# Patient Record
Sex: Female | Born: 1975 | Race: Black or African American | Hispanic: No | Marital: Single | Smoking: Never smoker
Health system: Southern US, Community
[De-identification: ages and names within clinical notes are randomized; demographics above are authoritative.]

## PROBLEM LIST (undated history)

## (undated) DIAGNOSIS — J45909 Unspecified asthma, uncomplicated: Secondary | ICD-10-CM

---

## 2000-12-19 ENCOUNTER — Emergency Department (HOSPITAL_COMMUNITY): Admission: EM | Admit: 2000-12-19 | Discharge: 2000-12-19 | Payer: Self-pay | Admitting: Emergency Medicine

## 2000-12-19 ENCOUNTER — Encounter: Payer: Self-pay | Admitting: Emergency Medicine

## 2001-06-10 ENCOUNTER — Emergency Department (HOSPITAL_COMMUNITY): Admission: EM | Admit: 2001-06-10 | Discharge: 2001-06-10 | Payer: Self-pay | Admitting: Emergency Medicine

## 2001-09-12 ENCOUNTER — Encounter: Admission: RE | Admit: 2001-09-12 | Discharge: 2001-10-22 | Payer: Self-pay | Admitting: Neurology

## 2002-02-25 ENCOUNTER — Emergency Department (HOSPITAL_COMMUNITY): Admission: EM | Admit: 2002-02-25 | Discharge: 2002-02-25 | Payer: Self-pay | Admitting: Emergency Medicine

## 2002-07-15 ENCOUNTER — Emergency Department (HOSPITAL_COMMUNITY): Admission: EM | Admit: 2002-07-15 | Discharge: 2002-07-15 | Payer: Self-pay

## 2002-07-17 ENCOUNTER — Emergency Department (HOSPITAL_COMMUNITY): Admission: EM | Admit: 2002-07-17 | Discharge: 2002-07-17 | Payer: Self-pay | Admitting: Emergency Medicine

## 2002-08-02 ENCOUNTER — Emergency Department (HOSPITAL_COMMUNITY): Admission: EM | Admit: 2002-08-02 | Discharge: 2002-08-02 | Payer: Self-pay | Admitting: Emergency Medicine

## 2002-10-06 ENCOUNTER — Emergency Department (HOSPITAL_COMMUNITY): Admission: EM | Admit: 2002-10-06 | Discharge: 2002-10-06 | Payer: Self-pay

## 2004-09-16 ENCOUNTER — Emergency Department (HOSPITAL_COMMUNITY): Admission: EM | Admit: 2004-09-16 | Discharge: 2004-09-16 | Payer: Self-pay | Admitting: Emergency Medicine

## 2004-09-23 ENCOUNTER — Emergency Department (HOSPITAL_COMMUNITY): Admission: EM | Admit: 2004-09-23 | Discharge: 2004-09-23 | Payer: Self-pay | Admitting: Emergency Medicine

## 2004-09-26 ENCOUNTER — Encounter: Admission: RE | Admit: 2004-09-26 | Discharge: 2004-09-26 | Payer: Self-pay | Admitting: Neurology

## 2005-12-07 ENCOUNTER — Emergency Department (HOSPITAL_COMMUNITY): Admission: EM | Admit: 2005-12-07 | Discharge: 2005-12-07 | Payer: Self-pay | Admitting: Emergency Medicine

## 2006-04-25 ENCOUNTER — Emergency Department (HOSPITAL_COMMUNITY): Admission: EM | Admit: 2006-04-25 | Discharge: 2006-04-25 | Payer: Self-pay | Admitting: *Deleted

## 2006-10-08 ENCOUNTER — Emergency Department (HOSPITAL_COMMUNITY): Admission: EM | Admit: 2006-10-08 | Discharge: 2006-10-08 | Payer: Self-pay | Admitting: *Deleted

## 2006-12-01 ENCOUNTER — Encounter: Admission: RE | Admit: 2006-12-01 | Discharge: 2006-12-01 | Payer: Self-pay | Admitting: *Deleted

## 2007-03-19 ENCOUNTER — Emergency Department (HOSPITAL_COMMUNITY): Admission: EM | Admit: 2007-03-19 | Discharge: 2007-03-19 | Payer: Self-pay | Admitting: Emergency Medicine

## 2007-09-02 ENCOUNTER — Emergency Department (HOSPITAL_COMMUNITY): Admission: EM | Admit: 2007-09-02 | Discharge: 2007-09-02 | Payer: Self-pay | Admitting: Emergency Medicine

## 2008-01-12 ENCOUNTER — Emergency Department (HOSPITAL_COMMUNITY): Admission: EM | Admit: 2008-01-12 | Discharge: 2008-01-13 | Payer: Self-pay | Admitting: Emergency Medicine

## 2009-05-26 ENCOUNTER — Encounter: Admission: RE | Admit: 2009-05-26 | Discharge: 2009-05-26 | Payer: Self-pay | Admitting: Family Medicine

## 2010-06-14 ENCOUNTER — Emergency Department (HOSPITAL_COMMUNITY): Admission: EM | Admit: 2010-06-14 | Discharge: 2010-06-15 | Payer: Self-pay | Admitting: Emergency Medicine

## 2010-07-03 ENCOUNTER — Emergency Department (HOSPITAL_COMMUNITY)
Admission: EM | Admit: 2010-07-03 | Discharge: 2010-07-03 | Payer: Self-pay | Source: Home / Self Care | Admitting: Emergency Medicine

## 2010-09-27 LAB — POCT PREGNANCY, URINE: Preg Test, Ur: NEGATIVE

## 2010-09-27 LAB — URINALYSIS, ROUTINE W REFLEX MICROSCOPIC
Bilirubin Urine: NEGATIVE
Glucose, UA: NEGATIVE mg/dL
Hgb urine dipstick: NEGATIVE
Protein, ur: NEGATIVE mg/dL
Urobilinogen, UA: 0.2 mg/dL (ref 0.0–1.0)

## 2010-09-29 LAB — URINALYSIS, ROUTINE W REFLEX MICROSCOPIC
Ketones, ur: NEGATIVE mg/dL
Protein, ur: NEGATIVE mg/dL
Specific Gravity, Urine: 1.032 — ABNORMAL HIGH (ref 1.005–1.030)
pH: 5.5 (ref 5.0–8.0)

## 2011-04-29 LAB — CBC
MCHC: 34.1
MCV: 86.2
Platelets: 211
WBC: 4.8

## 2011-04-29 LAB — I-STAT 8, (EC8 V) (CONVERTED LAB)
Glucose, Bld: 82
HCT: 44
Hemoglobin: 15
Potassium: 4
Sodium: 140
TCO2: 27
pCO2, Ven: 51.1 — ABNORMAL HIGH
pH, Ven: 7.303 — ABNORMAL HIGH

## 2011-04-29 LAB — DIFFERENTIAL
Eosinophils Absolute: 0.2
Eosinophils Relative: 5
Lymphocytes Relative: 34
Lymphs Abs: 1.6
Monocytes Absolute: 0.5
Neutrophils Relative %: 52

## 2011-04-29 LAB — POCT PREGNANCY, URINE: Preg Test, Ur: NEGATIVE

## 2011-04-29 LAB — POCT I-STAT CREATININE
Creatinine, Ser: 0.9
Operator id: 270111

## 2011-05-03 ENCOUNTER — Emergency Department (HOSPITAL_COMMUNITY): Payer: No Typology Code available for payment source

## 2011-05-03 ENCOUNTER — Emergency Department (HOSPITAL_COMMUNITY)
Admission: EM | Admit: 2011-05-03 | Discharge: 2011-05-03 | Disposition: A | Discharge status: Home / Self Care | Payer: No Typology Code available for payment source | Attending: Emergency Medicine | Admitting: Emergency Medicine

## 2011-05-03 DIAGNOSIS — M546 Pain in thoracic spine: Secondary | ICD-10-CM | POA: Insufficient documentation

## 2011-05-03 DIAGNOSIS — M25569 Pain in unspecified knee: Secondary | ICD-10-CM | POA: Insufficient documentation

## 2012-08-11 ENCOUNTER — Encounter (HOSPITAL_COMMUNITY): Payer: Self-pay | Admitting: Emergency Medicine

## 2012-08-11 ENCOUNTER — Emergency Department (HOSPITAL_COMMUNITY)
Admission: EM | Admit: 2012-08-11 | Discharge: 2012-08-11 | Disposition: A | Discharge status: Home / Self Care | Payer: Self-pay | Source: Home / Self Care | Attending: Family Medicine | Admitting: Family Medicine

## 2012-08-11 DIAGNOSIS — J45909 Unspecified asthma, uncomplicated: Secondary | ICD-10-CM

## 2012-08-11 HISTORY — DX: Unspecified asthma, uncomplicated: J45.909

## 2012-08-11 MED ORDER — PREDNISONE 10 MG PO TABS: ORAL_TABLET | ORAL | Status: DC

## 2012-08-11 MED ORDER — ALBUTEROL SULFATE (5 MG/ML) 0.5% IN NEBU
2.5000 mg | INHALATION_SOLUTION | Freq: Once | RESPIRATORY_TRACT | Status: AC
  Administered 2012-08-11: 2.5 mg via RESPIRATORY_TRACT

## 2012-08-11 MED ORDER — PREDNISONE 20 MG PO TABS
60.0000 mg | ORAL_TABLET | Freq: Every day | ORAL | Status: DC
  Administered 2012-08-11: 60 mg via ORAL

## 2012-08-11 MED ORDER — PREDNISONE 20 MG PO TABS
ORAL_TABLET | ORAL | Status: AC
  Filled 2012-08-11: qty 3

## 2012-08-11 MED ORDER — ALBUTEROL SULFATE (5 MG/ML) 0.5% IN NEBU
INHALATION_SOLUTION | RESPIRATORY_TRACT | Status: AC
  Filled 2012-08-11: qty 0.5

## 2012-08-11 NOTE — ED Notes (Signed)
Pt is here for asthma flare up x3 days. Saw here PCP on Wed for the same reason and was given Zpac that she's still taking and tolerating well Sx today include: dry cough, SOB, tightness of chest Denies: fevers, vomiting, nauseas, diarrhea Also taking ventolin and advair   She is alert w/no signs of acute distress.

## 2012-08-11 NOTE — ED Provider Notes (Signed)
History     CSN: [id number]  Arrival date & time 08/11/12  1442   First MD Initiated Contact with Patient 08/11/12 1550      Chief Complaint  Patient presents with  . Asthma    (Consider location/radiation/quality/duration/timing/severity/associated sxs/prior treatment) Patient is a 37 y.o. female presenting with asthma. The history is provided by the patient. No language interpreter was used.  Asthma This is a new problem. The current episode started more than 2 days ago. The problem occurs constantly. The problem has been gradually worsening. Nothing aggravates the symptoms. Nothing relieves the symptoms. She has tried nothing for the symptoms.  Pt complains of asthma for 4 days.  Pt saw Dr. Concepcion Elk and is on zithromax but she is still having asthma problems.  Past Medical History  Diagnosis Date  . Asthma     History reviewed. No pertinent past surgical history.  No family history on file.  History  Substance Use Topics  . Smoking status: Never Smoker   . Smokeless tobacco: Not on file  . Alcohol Use: No    OB History    Grav Para Term Preterm Abortions TAB SAB Ect Mult Living                  Review of Systems  Respiratory: Positive for cough and wheezing.   All other systems reviewed and are negative.    Allergies  Review of patient's allergies indicates no known allergies.  Home Medications  No current outpatient prescriptions on file.  BP 102/59  Pulse 70  Temp 98.8 F (37.1 C) (Oral)  Resp 16  SpO2 100%  LMP 08/11/2012  Physical Exam  Nursing note and vitals reviewed. Constitutional: She appears well-developed and well-nourished.  HENT:  Head: Normocephalic and atraumatic.  Eyes: Pupils are equal, round, and reactive to light.  Neck: Normal range of motion. Neck supple.  Cardiovascular: Normal rate.   Pulmonary/Chest: Effort normal. She has wheezes.  Abdominal: Soft.  Musculoskeletal: Normal range of motion.  Neurological: She is alert.   Skin: Skin is warm.    ED Course  Procedures (including critical care time)  Labs Reviewed - No data to display No results found.   No diagnosis found.    MDM  Prednisone po albuterol neb.   Pt given rx for prednisone tape        Lonia Skinner [place], [place] 08/11/12 1733

## 2012-08-13 NOTE — ED Provider Notes (Signed)
Medical screening examination/treatment/procedure(s) were performed by resident physician or non-physician practitioner and as supervising physician I was immediately available for consultation/collaboration.   Heitor Steinhoff DOUGLAS MD.    Karem Farha D Hilbert Briggs, MD 08/13/12 1600 

## 2012-10-03 ENCOUNTER — Other Ambulatory Visit: Payer: Self-pay | Admitting: Internal Medicine

## 2012-10-03 DIAGNOSIS — N644 Mastodynia: Secondary | ICD-10-CM

## 2012-10-10 ENCOUNTER — Other Ambulatory Visit: Payer: Self-pay | Admitting: Internal Medicine

## 2012-10-10 ENCOUNTER — Ambulatory Visit
Admission: RE | Admit: 2012-10-10 | Discharge: 2012-10-10 | Disposition: A | Discharge status: Home / Self Care | Payer: No Typology Code available for payment source | Source: Ambulatory Visit | Attending: Internal Medicine | Admitting: Internal Medicine

## 2012-10-10 DIAGNOSIS — N644 Mastodynia: Secondary | ICD-10-CM
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
## 2013-05-23 ENCOUNTER — Other Ambulatory Visit: Payer: Self-pay | Admitting: Internal Medicine

## 2013-05-23 DIAGNOSIS — N644 Mastodynia: Secondary | ICD-10-CM

## 2013-06-11 ENCOUNTER — Ambulatory Visit
Admission: RE | Admit: 2013-06-11 | Discharge: 2013-06-11 | Disposition: A | Discharge status: Home / Self Care | Payer: No Typology Code available for payment source | Source: Ambulatory Visit | Attending: Internal Medicine | Admitting: Internal Medicine

## 2013-06-11 ENCOUNTER — Other Ambulatory Visit: Payer: Self-pay | Admitting: Internal Medicine

## 2013-06-11 DIAGNOSIS — N644 Mastodynia: Secondary | ICD-10-CM

## 2013-06-18 ENCOUNTER — Other Ambulatory Visit: Payer: Self-pay

## 2013-08-22 ENCOUNTER — Other Ambulatory Visit: Payer: Self-pay

## 2013-08-29 ENCOUNTER — Inpatient Hospital Stay: Admission: RE | Admit: 2013-08-29 | Payer: Self-pay | Source: Ambulatory Visit

## 2013-09-10 ENCOUNTER — Ambulatory Visit
Admission: RE | Admit: 2013-09-10 | Discharge: 2013-09-10 | Disposition: A | Discharge status: Home / Self Care | Payer: No Typology Code available for payment source | Source: Ambulatory Visit | Attending: Internal Medicine | Admitting: Internal Medicine

## 2013-09-10 ENCOUNTER — Other Ambulatory Visit: Payer: Self-pay

## 2013-09-10 DIAGNOSIS — N644 Mastodynia: Secondary | ICD-10-CM

## 2015-04-05 ENCOUNTER — Emergency Department (HOSPITAL_COMMUNITY)
Admission: EM | Admit: 2015-04-05 | Discharge: 2015-04-05 | Disposition: A | Discharge status: Home / Self Care | Payer: Self-pay | Attending: Emergency Medicine | Admitting: Emergency Medicine

## 2015-04-05 ENCOUNTER — Encounter (HOSPITAL_COMMUNITY): Payer: Self-pay | Admitting: *Deleted

## 2015-04-05 DIAGNOSIS — L298 Other pruritus: Secondary | ICD-10-CM | POA: Insufficient documentation

## 2015-04-05 DIAGNOSIS — J45909 Unspecified asthma, uncomplicated: Secondary | ICD-10-CM | POA: Insufficient documentation

## 2015-04-05 DIAGNOSIS — L299 Pruritus, unspecified: Secondary | ICD-10-CM

## 2015-04-05 MED ORDER — LORATADINE 10 MG PO TABS: 10.0000 mg | ORAL_TABLET | Freq: Every day | ORAL | Status: AC

## 2015-04-05 NOTE — ED Notes (Signed)
Pt reports generalized itching and tingling x 2 weeks intermittently.  Pt reports she was seen by her PCP x 10 days ago for same and had blood work done.  Pt denies any rash but states that when she gets the itching episode, she also has tingling and gets hot.  Pt denies ever taking benadryl for the itching.

## 2015-04-05 NOTE — ED Provider Notes (Signed)
CSN: [id number]     Arrival date & time 04/05/15  1114 History  This chart was scribed for non-physician practitioner, Eyvonne Mechanic, PA-C,  working with Nelva Nay, MD by Lauren Busman, ED Scribe. This patient was seen in room WTR5/WTR5 and the patient's care was started at 1:16 PM.    Chief Complaint  Patient presents with  . Pruritis   The history is provided by the patient. No language interpreter was used.    HPI Comments:  Lauren Houston is a 39 y.o. female who presents to the Emergency Department complaining of diffuse pruritis intermittently for 2 weeks. She notes her symptom is worse at night. She also describes a tingling sensation. She denies a history of the same. She also denies changes in soaps, lotions, fragrances, bedding, recent insect bites, rash, rhinorrhea, fever, and history of allergies. No alleviating factors noted. Pt notes she was evaluated by her PCP on 03/25/15;  states he did bloodwork that was negative except for low Vitamin D. Pt states her PCP instructed her to follow up this week.Denies rash.  PCP Advery    Past Medical History  Diagnosis Date  . Asthma    History reviewed. No pertinent past surgical history. No family history on file. Social History  Substance Use Topics  . Smoking status: Never Smoker   . Smokeless tobacco: None  . Alcohol Use: No   OB History    No data available     Review of Systems  A complete 10 system review of systems was obtained and all systems are negative except as noted in the HPI and PMH.    Allergies  Review of patient's allergies indicates no known allergies.  Home Medications   Prior to Admission medications   Medication Sig Start Date End Date Taking? Authorizing Provider  loratadine (CLARITIN) 10 MG tablet Take 1 tablet (10 mg total) by mouth daily. 04/05/15   Jeffrey Hedges, PA-C   BP 101/61 mmHg  Pulse 78  Temp(Src) 98.4 F (36.9 C) (Oral)  Resp 14  SpO2 100%  LMP 03/15/2015   Physical Exam   Constitutional: She is oriented to person, place, and time. She appears well-developed and well-nourished. No distress.  HENT:  Head: Normocephalic and atraumatic.  Mouth/Throat: Oropharynx is clear and moist. No oropharyngeal exudate.  Eyes: Conjunctivae are normal.  Cardiovascular: Normal rate.   Pulmonary/Chest: Effort normal.  Abdominal: She exhibits no distension.  Neurological: She is alert and oriented to person, place, and time.  Skin: Skin is warm and dry. No rash noted. No erythema. No pallor.  Psychiatric: She has a normal mood and affect.  Nursing note and vitals reviewed.   ED Course  Procedures   Labs Review Labs Reviewed - No data to display  Imaging Review No results found. I have personally reviewed and evaluated these images and lab results as part of my medical decision-making.   EKG Interpretation None      MDM   Final diagnoses:  Pruritus    Labs:   Imaging:  Consults:  Therapeutics:  Discharge Meds:   Assessment/Plan: Patient presents with pruritus she is seen by her primary care for this with laboratory evaluation. I am uncertain what labs were drawn as I cannot access this in our chart. Patient has not tried any symptomatic therapies at this time, uncertain etiology of her itching. Patient has no fever, vital signs reassuring, no significant findings on exam. She'll be instructed use Claritin, follow-up with her primary care for reevaluation.  I personally performed the services described in this documentation, which was scribed in my presence. The recorded information has been reviewed and is accurate.    Eyvonne Mechanic, PA-C 04/05/15 1338  Nelva Nay, MD 04/06/15 [phone]

## 2015-04-05 NOTE — Discharge Instructions (Signed)
Pruritus  °Pruritus is an itch. There are many different problems that can cause an itch. Dry skin is one of the most common causes of itching. Most cases of itching do not require medical attention.  °HOME CARE INSTRUCTIONS  °Make sure your skin is moistened on a regular basis. A moisturizer that contains petroleum jelly is best for keeping moisture in your skin. If you develop a rash, you may try the following for relief:  °· Use corticosteroid cream. °· Apply cool compresses to the affected areas. °· Bathe with Epsom salts or baking soda in the bathwater. °· Soak in colloidal oatmeal baths. These are available at your pharmacy. °· Apply baking soda paste to the rash. Stir water into baking soda until it reaches a paste-like consistency. °· Use an anti-itch lotion. °· Take over-the-counter diphenhydramine medicine by mouth as the instructions direct. °· Avoid scratching. Scratching may cause the rash to become infected. If itching is very bad, your caregiver may suggest prescription lotions or creams to lessen your symptoms. °· Avoid hot showers, which can make itching worse. A cold shower may help with itching as long as you use a moisturizer after the shower. °SEEK MEDICAL CARE IF: °The itching does not go away after several days. °Document Released: 03/16/2011 Document Revised: 11/18/2013 Document Reviewed: 03/16/2011 °[employer]® Patient Information ©2015 [employer]. This information is not intended to replace advice given to you by your health care provider. Make sure you discuss any questions you have with your health care provider. ° °

## 2022-03-17 ENCOUNTER — Encounter (HOSPITAL_COMMUNITY): Payer: Self-pay

## 2022-03-17 ENCOUNTER — Emergency Department (HOSPITAL_COMMUNITY): Payer: Self-pay

## 2022-03-17 ENCOUNTER — Emergency Department (HOSPITAL_COMMUNITY)
Admission: EM | Admit: 2022-03-17 | Discharge: 2022-03-17 | Disposition: A | Discharge status: Home / Self Care | Payer: Self-pay | Attending: Emergency Medicine | Admitting: Emergency Medicine

## 2022-03-17 ENCOUNTER — Ambulatory Visit (HOSPITAL_COMMUNITY): Admission: RE | Admit: 2022-03-17 | Payer: Self-pay | Source: Ambulatory Visit

## 2022-03-17 ENCOUNTER — Emergency Department (HOSPITAL_BASED_OUTPATIENT_CLINIC_OR_DEPARTMENT_OTHER): Payer: Self-pay

## 2022-03-17 DIAGNOSIS — M7989 Other specified soft tissue disorders: Secondary | ICD-10-CM | POA: Insufficient documentation

## 2022-03-17 DIAGNOSIS — R109 Unspecified abdominal pain: Secondary | ICD-10-CM

## 2022-03-17 DIAGNOSIS — R202 Paresthesia of skin: Secondary | ICD-10-CM | POA: Insufficient documentation

## 2022-03-17 DIAGNOSIS — R200 Anesthesia of skin: Secondary | ICD-10-CM

## 2022-03-17 DIAGNOSIS — N9489 Other specified conditions associated with female genital organs and menstrual cycle: Secondary | ICD-10-CM | POA: Insufficient documentation

## 2022-03-17 LAB — COMPREHENSIVE METABOLIC PANEL
ALT: 15 U/L (ref 0–44)
AST: 18 U/L (ref 15–41)
Albumin: 3.9 g/dL (ref 3.5–5.0)
Alkaline Phosphatase: 62 U/L (ref 38–126)
Anion gap: 4 — ABNORMAL LOW (ref 5–15)
BUN: 7 mg/dL (ref 6–20)
CO2: 26 mmol/L (ref 22–32)
Calcium: 9.1 mg/dL (ref 8.9–10.3)
Chloride: 108 mmol/L (ref 98–111)
Creatinine, Ser: 0.97 mg/dL (ref 0.44–1.00)
GFR, Estimated: >60 mL/min (ref >60)
Glucose, Bld: 91 mg/dL (ref 70–99)
Potassium: 4.3 mmol/L (ref 3.5–5.1)
Sodium: 138 mmol/L (ref 135–145)
Total Bilirubin: 0.6 mg/dL (ref 0.3–1.2)
Total Protein: 7.3 g/dL (ref 6.5–8.1)

## 2022-03-17 LAB — CBC WITH DIFFERENTIAL/PLATELET
Abs Immature Granulocytes: 0.01 10*3/uL (ref 0.00–0.07)
Basophils Absolute: 0 10*3/uL (ref 0.0–0.1)
Basophils Relative: 0 %
Eosinophils Absolute: 0.1 10*3/uL (ref 0.0–0.5)
Eosinophils Relative: 1 %
HCT: 43 % (ref 36.0–46.0)
Hemoglobin: 13.7 g/dL (ref 12.0–15.0)
Immature Granulocytes: 0 %
Lymphocytes Relative: 31 %
Lymphs Abs: 1.3 10*3/uL (ref 0.7–4.0)
MCH: 28.7 pg (ref 26.0–34.0)
MCHC: 31.9 g/dL (ref 30.0–36.0)
MCV: 90.1 fL (ref 80.0–100.0)
Monocytes Absolute: 0.4 10*3/uL (ref 0.1–1.0)
Monocytes Relative: 8 %
Neutro Abs: 2.5 10*3/uL (ref 1.7–7.7)
Neutrophils Relative %: 60 %
Platelets: 257 10*3/uL (ref 150–400)
RBC: 4.77 MIL/uL (ref 3.87–5.11)
RDW: 12.1 % (ref 11.5–15.5)
WBC: 4.3 10*3/uL (ref 4.0–10.5)
nRBC: 0 % (ref 0.0–0.2)

## 2022-03-17 LAB — URINALYSIS, ROUTINE W REFLEX MICROSCOPIC
Bilirubin Urine: NEGATIVE
Glucose, UA: NEGATIVE mg/dL
Hgb urine dipstick: NEGATIVE
Ketones, ur: NEGATIVE mg/dL
Leukocytes,Ua: NEGATIVE
Nitrite: NEGATIVE
Protein, ur: NEGATIVE mg/dL
Specific Gravity, Urine: 1.006 (ref 1.005–1.030)
pH: 6 (ref 5.0–8.0)

## 2022-03-17 LAB — HCG, QUANTITATIVE, PREGNANCY: hCG, Beta Chain, Quant, S: <1 m[IU]/mL (ref <5)

## 2022-03-17 LAB — LIPASE, BLOOD: Lipase: 33 U/L (ref 11–51)

## 2022-03-17 MED ORDER — IOHEXOL 300 MG/ML  SOLN
100.0000 mL | Freq: Once | INTRAMUSCULAR | Status: AC | PRN
  Administered 2022-03-17: 100 mL via INTRAVENOUS

## 2022-03-17 NOTE — ED Provider Triage Note (Signed)
Emergency Medicine Provider Triage Evaluation Note  Lauren Houston , a 46 y.o. female  was evaluated in triage.  Pt complains of waxing and waning right upper quadrant pain over the last 2 to 3 weeks.  Usually better in the morning on an empty stomach when she has a little bit of water to drink, and worsened by food intake.  With some intermittent nausea.  Denies vomiting, diarrhea, neck stiffness, fever or chills.  Also with some sensation radiating to the right mid back and of the left shoulder area down the arm.  Denies chest pain, shortness of breath, or any other neurodeficits.  Review of Systems  Positive:  Negative: See above  Physical Exam  BP (!) 133/98 (BP Location: Left Arm)   Pulse 93   Temp 97.9 F (36.6 C)   Resp 16   Ht 5\' 4"  (1.626 m)   Wt 81.6 kg   SpO2 99%   BMI 30.90 kg/m  Gen:   Awake, no distress   Resp:  Normal effort  MSK:   Moves extremities without difficulty  Other:  RUQ tenderness, abdomen soft.  Coordination and strength appear intact of upper extremities.  No facial asymmetry.  Mild subjective tingling of left upper extremity, not worsened by ROM.  Medical Decision Making  Medically screening exam initiated at 12:29 PM.  Appropriate orders placed.  Lauren Houston was informed that the remainder of the evaluation will be completed by another provider, this initial triage assessment does not replace that evaluation, and the importance of remaining in the ED until their evaluation is complete.    , PA-C 03/17/22 1238

## 2022-03-17 NOTE — ED Notes (Signed)
Pt brought to CT, given 100 ml omni contrast and right as starting scan pt stated she was having panic attack and attempting to crawl out of scanner. Pt states she could not calm down and wanted to try scan later.

## 2022-03-17 NOTE — ED Notes (Signed)
IV  

## 2022-03-17 NOTE — Progress Notes (Signed)
Left UE venous duplex study completed. Notified MD Charm Barges of results. Please see CV Proc for preliminary results.  Sophiah Rolin BS, RVT 03/17/2022 7:02 PM

## 2022-03-17 NOTE — Discharge Instructions (Addendum)
You were seen in the emergency department for right-sided abdominal and flank pain and left arm numbness.  You had blood work and urinalysis, and ultrasound of your left arm.  We attempted to do a CAT scan but were unsuccessful.  Please follow-up with your primary care doctor.  You can use Tylenol and ibuprofen for pain.  Keep well-hydrated.  Return to the emergency department if any worsening or concerning symptoms

## 2022-03-17 NOTE — ED Triage Notes (Signed)
Pt c/o 1-2 weeks of R sided flank pain and LUE numbness. Pt denies hematuria, dysuria, unilateral weakness. NIH 1 for sensation to LUE.

## 2022-03-17 NOTE — ED Provider Notes (Signed)
[facility]-EMERGENCY DEPT Provider Note   CSN: [id number] Arrival date & time: 03/17/22  1200     History  Chief Complaint  Patient presents with   Flank Pain   Numbness    Lauren Houston is a 46 y.o. female.  She has no significant past medical history.  She is complaining of some right mid abdominal pain and flank pain its been going on for 2 weeks.  No known trauma no urinary symptoms no vaginal bleeding or discharge.  She is also noticed some tingling in her left arm from her shoulder down to her fingertips.  She is right-handed.  She feels like there is a knot and tenderness on her left upper arm.  She saw her primary care doctor for this and he gave her some muscle relaxant for her side pain and told her it was muscular.  He was not sure what the arm numbness was due to.  She has been trying some cranberry juice and drinking plenty of fluids because she feels like she is dehydrated.  No fevers or chills.  The history is provided by the patient.  Flank Pain This is a new problem. The current episode started more than 1 week ago. The problem occurs constantly. The problem has not changed since onset.Associated symptoms include abdominal pain. Pertinent negatives include no chest pain, no headaches and no shortness of breath. The symptoms are aggravated by bending. Nothing relieves the symptoms. She has tried rest for the symptoms. The treatment provided no relief.       Home Medications Prior to Admission medications   Medication Sig Start Date End Date Taking? Authorizing Provider  loratadine (CLARITIN) 10 MG tablet Take 1 tablet (10 mg total) by mouth daily. 04/05/15   Eyvonne Mechanic, PA-C      Allergies    Patient has no known allergies.    Review of Systems   Review of Systems  Constitutional:  Negative for fever.  Eyes:  Negative for visual disturbance.  Respiratory:  Negative for shortness of breath.   Cardiovascular:  Negative for chest pain.   Gastrointestinal:  Positive for abdominal pain and nausea. Negative for constipation, diarrhea and vomiting.  Genitourinary:  Positive for flank pain. Negative for dysuria, vaginal bleeding and vaginal discharge.  Musculoskeletal:  Negative for neck pain.  Skin:  Negative for rash.  Neurological:  Positive for numbness. Negative for headaches.    Physical Exam Updated Vital Signs BP 127/84   Pulse (!) 59   Temp 98.5 F (36.9 C) (Oral)   Resp 16   Ht 5\' 4"  (1.626 m)   Wt 81.6 kg   SpO2 100%   BMI 30.90 kg/m  Physical Exam Vitals and nursing note reviewed.  Constitutional:      General: She is not in acute distress.    Appearance: Normal appearance. She is well-developed.  HENT:     Head: Normocephalic and atraumatic.  Eyes:     Conjunctiva/sclera: Conjunctivae normal.  Cardiovascular:     Rate and Rhythm: Normal rate and regular rhythm.     Heart sounds: No murmur heard. Pulmonary:     Effort: Pulmonary effort is normal. No respiratory distress.     Breath sounds: Normal breath sounds.  Abdominal:     Palpations: Abdomen is soft.     Tenderness: There is no abdominal tenderness. There is no guarding or rebound.  Musculoskeletal:        General: Tenderness (left upper arm) present. No swelling.  Normal range of motion.     Cervical back: Neck supple.  Skin:    General: Skin is warm and dry.     Capillary Refill: Capillary refill takes less than 2 seconds.  Neurological:     Mental Status: She is alert.     Cranial Nerves: No cranial nerve deficit.     Sensory: Sensory deficit (Subjective decrease sensation of right arm from shoulder down to fingers.  Proprioception and strength intact.) present.     Motor: No weakness.     Coordination: Coordination normal.     Gait: Gait normal.  Psychiatric:        Mood and Affect: Mood normal.     ED Results / Procedures / Treatments   Labs (all labs ordered are listed, but only abnormal results are displayed) Labs Reviewed   COMPREHENSIVE METABOLIC PANEL - Abnormal; Notable for the following components:      Result Value   Anion gap 4 (*)    All other components within normal limits  URINALYSIS, ROUTINE W REFLEX MICROSCOPIC - Abnormal; Notable for the following components:   Color, Urine STRAW (*)    All other components within normal limits  CBC WITH DIFFERENTIAL/PLATELET  LIPASE, BLOOD  HCG, QUANTITATIVE, PREGNANCY    EKG None  Radiology CT ABDOMEN PELVIS W CONTRAST  Result Date: 03/18/2022 CLINICAL DATA:  46 year old female with right lower quadrant abdominal pain. EXAM: CT ABDOMEN AND PELVIS WITH CONTRAST TECHNIQUE: Multidetector CT imaging of the abdomen and pelvis was performed using the standard protocol following bolus administration of intravenous contrast. RADIATION DOSE REDUCTION: This exam was performed according to the departmental dose-optimization program which includes automated exposure control, adjustment of the mA and/or kV according to patient size and/or use of iterative reconstruction technique. CONTRAST:  OMNIPAQUE IOHEXOL 300 MG/ML  SOLN COMPARISON:  Abdominal radiographic series 07/03/2010. FINDINGS: IV contrast was administered, and the imaging was begun. But the patient crawled out of the scanner after imaging of only 6 cm from the lung bases to the upper abdomen. Associated motion artifact in those images. Right lung base appears clear. Grossly unremarkable visible liver, gallbladder, spleen, pancreas, adrenal glands, kidneys, and bowel in the left upper quadrant. IMPRESSION: Essentially Nondiagnostic; patient aborted the scan after acquiring 6 cm worth of imaging from the lung base to the upper abdomen. Electronically Signed   By: Lauren Fleming M.D.   On: 03/18/2022 07:46   UE VENOUS DUPLEX (7am - 7pm)  Result Date: 03/17/2022 UPPER VENOUS STUDY  Patient Name:  Lauren Houston  Date of Exam:   03/17/2022 Medical Rec #: [id number]       Accession #:    [id number] Date of Birth: 1976/03/12        Patient Gender: F Patient Age:   85 years Exam Location:  [facility] Procedure:      VAS [place] UPPER EXTREMITY VENOUS DUPLEX Referring Phys: Lauren Houston --------------------------------------------------------------------------------  Indications: Pain Comparison Study: No previous exam noted. Performing Technologist: Magdalene River BS, RVT  Examination Guidelines: A complete evaluation includes B-mode imaging, spectral Doppler, color Doppler, and power Doppler as needed of all accessible portions of each vessel. Bilateral testing is considered an integral part of a complete examination. Limited examinations for reoccurring indications may be performed as noted.  Right Findings: +----------+------------+---------+-----------+----------+-------+ RIGHT     CompressiblePhasicitySpontaneousPropertiesSummary +----------+------------+---------+-----------+----------+-------+ Subclavian    Full       Yes       Yes                      +----------+------------+---------+-----------+----------+-------+  Left Findings: +----------+------------+---------+-----------+----------+--------------+ LEFT      CompressiblePhasicitySpontaneousProperties   Summary     +----------+------------+---------+-----------+----------+--------------+ IJV           Full       Yes       Yes                             +----------+------------+---------+-----------+----------+--------------+ Subclavian    Full       Yes       Yes                             +----------+------------+---------+-----------+----------+--------------+ Axillary      Full       Yes       Yes                             +----------+------------+---------+-----------+----------+--------------+ Brachial      Full                                                 +----------+------------+---------+-----------+----------+--------------+ Radial        Full                                                  +----------+------------+---------+-----------+----------+--------------+ Ulnar       Partial                                                +----------+------------+---------+-----------+----------+--------------+ Cephalic                                            Not visualized +----------+------------+---------+-----------+----------+--------------+ Basilic                                             Not visualized +----------+------------+---------+-----------+----------+--------------+  Summary:  Right: No evidence of thrombosis in the subclavian.  Left: No evidence of deep vein thrombosis in the upper extremity. The left superficial veins were not visualized secondary to diminutive size.  *See table(s) above for measurements and observations.    Preliminary     Procedures Procedures    Medications Ordered in ED Medications  iohexol (OMNIPAQUE) 300 MG/ML solution 100 mL (100 mLs Intravenous Contrast Given 03/17/22 2044)    ED Course/ Medical Decision Making/ A&P Clinical Course as of 03/18/22 2094  Thu Mar 17, 2022  1858 Duplex left upper extremity preliminary negative per tech. [MB]  2143 Apparently patient's IV was not adequate for IV contrast but now has a new one placed and awaiting CT. [MB]  2250 Patient apparently had a "panic attack" to receiving IV contrast and they were unable to complete scan.  I went to evaluate her she is resting comfortably and feels better.  She said  she has had a panic attack before.  She does not want to undergo another scan even a noncontrast scan and would rather just follow-up with her primary care doctor.  Her lab work was otherwise unremarkable so I think this is reasonable.  Return instructions discussed [MB]    Clinical Course User Index [MB] Terrilee Files, MD                           Medical Decision Making Amount and/or Complexity of Data Reviewed Radiology: ordered.  Risk Prescription drug management.  This patient  complains of mid abdominal and flank pain, left arm numbness; this involves an extensive number of treatment Options and is a complaint that carries with it a high risk of complications and morbidity. The differential includes paresthesias, peripheral nerve injury, DVT, renal colic, peptic ulcer disease, biliary colic, pyelonephritis  I ordered, reviewed and interpreted labs, which included CBC with normal white count normal hemoglobin, chemistries and LFTs normal, urinalysis negative, pregnancy test negative I ordered imaging studies which included venous duplex left upper extremity, CT abdomen and pelvis and I independently    visualized and interpreted imaging which showed no evidence of DVT.  Patient unable to tolerate CT abdomen and pelvis with contrast  Previous records obtained and reviewed in epic no recent admissions Cardiac monitoring reviewed, normal sinus rhythm Social determinants considered, no significant barriers Critical Interventions: None  After the interventions stated above, I reevaluated the patient and found patient to be hemodynamically stable with a benign abdominal exam Admission and further testing considered, patient declines noncontrast CT or further intervention at this time.  She wishes to be discharged with follow-up with PCP.  Return instructions discussed          Final Clinical Impression(s) / ED Diagnoses Final diagnoses:  Right flank pain  Left arm numbness    Rx / DC Orders ED Discharge Orders     None         Terrilee Files, MD 03/18/22 [phone]

## 2022-03-18 ENCOUNTER — Other Ambulatory Visit (HOSPITAL_COMMUNITY): Payer: Self-pay | Admitting: Emergency Medicine

## 2022-03-18 ENCOUNTER — Ambulatory Visit (HOSPITAL_COMMUNITY)
Admission: RE | Admit: 2022-03-18 | Discharge: 2022-03-18 | Disposition: A | Discharge status: Home / Self Care | Payer: Self-pay | Source: Ambulatory Visit | Attending: Emergency Medicine | Admitting: Emergency Medicine

## 2022-03-18 DIAGNOSIS — R1031 Right lower quadrant pain: Secondary | ICD-10-CM | POA: Insufficient documentation

## 2022-05-14 ENCOUNTER — Encounter (HOSPITAL_COMMUNITY): Payer: Self-pay

## 2022-05-14 ENCOUNTER — Emergency Department (HOSPITAL_COMMUNITY)
Admission: EM | Admit: 2022-05-14 | Discharge: 2022-05-14 | Disposition: A | Discharge status: Home / Self Care | Payer: Self-pay | Attending: Emergency Medicine | Admitting: Emergency Medicine

## 2022-05-14 DIAGNOSIS — J45909 Unspecified asthma, uncomplicated: Secondary | ICD-10-CM | POA: Insufficient documentation

## 2022-05-14 DIAGNOSIS — U071 COVID-19: Secondary | ICD-10-CM | POA: Insufficient documentation

## 2022-05-14 LAB — SARS CORONAVIRUS 2 BY RT PCR: SARS Coronavirus 2 by RT PCR: POSITIVE — AB

## 2022-05-14 MED ORDER — IPRATROPIUM BROMIDE 0.02 % IN SOLN
0.5000 mg | Freq: Once | RESPIRATORY_TRACT | Status: AC
Start: 2022-05-14 — End: 2022-05-14
  Administered 2022-05-14: 0.5 mg via RESPIRATORY_TRACT
  Filled 2022-05-14: qty 2.5

## 2022-05-14 MED ORDER — DEXAMETHASONE 4 MG PO TABS
10.0000 mg | ORAL_TABLET | Freq: Once | ORAL | Status: AC
  Administered 2022-05-14: 10 mg via ORAL
  Filled 2022-05-14: qty 1

## 2022-05-14 MED ORDER — ALBUTEROL SULFATE HFA 108 (90 BASE) MCG/ACT IN AERS: 2.0000 | INHALATION_SPRAY | RESPIRATORY_TRACT | Status: DC | PRN

## 2022-05-14 MED ORDER — PREDNISONE 20 MG PO TABS: 40.0000 mg | ORAL_TABLET | Freq: Every day | ORAL | 0 refills | Status: AC

## 2022-05-14 MED ORDER — PREDNISONE 20 MG PO TABS: 40.0000 mg | ORAL_TABLET | Freq: Every day | ORAL | 0 refills | Status: DC

## 2022-05-14 MED ORDER — ALBUTEROL SULFATE (2.5 MG/3ML) 0.083% IN NEBU
5.0000 mg | INHALATION_SOLUTION | Freq: Once | RESPIRATORY_TRACT | Status: AC
  Administered 2022-05-14: 5 mg via RESPIRATORY_TRACT
  Filled 2022-05-14: qty 6

## 2022-05-14 NOTE — ED Triage Notes (Signed)
Pt states that she has been having cold symptoms, cough, SOB, congestion, diarrhea.

## 2022-05-14 NOTE — ED Provider Notes (Signed)
[facility]-EMERGENCY DEPT Provider Note   CSN: [id number] Arrival date & time: 05/14/22  0033     History  Chief Complaint  Patient presents with   Shortness of Breath    Lauren Houston is a 46 y.o. female.  46 year old female with a history of asthma presents to the emergency department for upper respiratory symptoms.  Onset of symptoms was last Thursday.  She reports myalgias as well as cough, nasal congestion, headache.  Notes subjective fever as well; highest documented temperature approximately 41F.  Over the last day she has developed increasing chest tightness.  She used her home albuterol inhaler with little relief.  Came to the emergency department because she believes she needs a breathing treatment.  Denies hemoptysis.  The history is provided by the patient. No language interpreter was used.  Shortness of Breath      Home Medications Prior to Admission medications   Medication Sig Start Date End Date Taking? Authorizing Provider  loratadine (CLARITIN) 10 MG tablet Take 1 tablet (10 mg total) by mouth daily. 04/05/15   Hedges, Tinnie Gens, PA-C  predniSONE (DELTASONE) 20 MG tablet Take 2 tablets (40 mg total) by mouth daily. 05/14/22   Antony Madura, PA-C      Allergies    Patient has no known allergies.    Review of Systems   Review of Systems  Respiratory:  Positive for shortness of breath.   Ten systems reviewed and are negative for acute change, except as noted in the HPI.    Physical Exam Updated Vital Signs BP (!) 129/93 (BP Location: Right Arm)   Pulse 99   Temp 98.3 F (36.8 C) (Oral)   Resp 20   Ht 5\' 4"  (1.626 m)   Wt 81.6 kg   SpO2 100%   BMI 30.90 kg/m   Physical Exam Vitals and nursing note reviewed.  Constitutional:      General: She is not in acute distress.    Appearance: She is well-developed. She is not diaphoretic.     Comments: Nontoxic-appearing and in no acute distress.  HENT:     Head: Normocephalic and  atraumatic.  Eyes:     General: No scleral icterus.    Conjunctiva/sclera: Conjunctivae normal.  Cardiovascular:     Rate and Rhythm: Normal rate and regular rhythm.     Pulses: Normal pulses.  Pulmonary:     Effort: Pulmonary effort is normal. No respiratory distress.     Breath sounds: No stridor. No wheezing, rhonchi or rales.     Comments: Respirations even and unlabored.  Lungs clear to auscultation bilaterally. Musculoskeletal:        General: Normal range of motion.     Cervical back: Normal range of motion.  Skin:    General: Skin is warm and dry.     Coloration: Skin is not pale.     Findings: No erythema or rash.  Neurological:     Mental Status: She is alert and oriented to person, place, and time.     Coordination: Coordination normal.  Psychiatric:        Behavior: Behavior normal.     ED Results / Procedures / Treatments   Labs (all labs ordered are listed, but only abnormal results are displayed) Labs Reviewed  SARS CORONAVIRUS 2 BY RT PCR - Abnormal; Notable for the following components:      Result Value   SARS Coronavirus 2 by RT PCR POSITIVE (*)    All other components  within normal limits    EKG EKG Interpretation  Date/Time:  Saturday May 14 2022 00:49:24 EDT Ventricular Rate:  95 PR Interval:  136 QRS Duration: 83 QT Interval:  333 QTC Calculation: 419 R Axis:   63 Text Interpretation: Sinus rhythm Borderline T wave abnormalities Baseline wander in lead(s) II aVR V4 V5 No previous ECGs available Confirmed by Shanon Rosser [phone]) on 05/14/2022 2:54:53 AM  Radiology No results found.  Procedures Procedures    Medications Ordered in ED Medications  albuterol (VENTOLIN HFA) 108 (90 Base) MCG/ACT inhaler 2 puff (has no administration in time range)  dexamethasone (DECADRON) tablet 10 mg (10 mg Oral Given 05/14/22 0405)  albuterol (PROVENTIL) (2.5 MG/3ML) 0.083% nebulizer solution 5 mg (5 mg Nebulization Given 05/14/22 0415)  ipratropium  (ATROVENT) nebulizer solution 0.5 mg (0.5 mg Nebulization Given 05/14/22 0415)    ED Course/ Medical Decision Making/ A&P Clinical Course as of 05/14/22 0537  Sat May 14, 2022  0427 Patient ambulated to the restroom O2 stayed between 96-100% [KH]    Clinical Course User Index [KH] Antonietta Breach, PA-C                           Medical Decision Making Risk Prescription drug management.   This patient presents to the ED for concern of SOB, this involves an extensive number of treatment options, and is a complaint that carries with it a high risk of complications and morbidity.  The differential diagnosis includes viral illness vs PNA vs PTX vs asthma exacerbation vs anxiety   Co morbidities that complicate the patient evaluation  Asthma    Additional history obtained:  External records from outside source obtained and reviewed including normal right lung base from CT abdomen/pelvis on 03/18/2022   Lab Tests:  I Ordered, and personally interpreted labs.  The pertinent results include:  COVID positive PCR   Cardiac Monitoring:  The patient was maintained on a cardiac monitor.  I personally viewed and interpreted the cardiac monitored which showed an underlying rhythm of: NSR   Medicines ordered and prescription drug management:  I ordered medication including albuterol/atroven nebulizer as well as PO decadron for SOB  Reevaluation of the patient after these medicines showed that the patient improved I have reviewed the patients home medicines and have made adjustments as needed   Test Considered:  CXR   Problem List / ED Course:  SOB with positive COVID test Suspect post-viral bronchitis. Lungs CTAB. No wheezing, hypoxia. Doubt PTX. Also doubt PNA given lack of fever, tachypnea, dyspnea, hypoxia. Ambulatory in the ED without desaturation Feeling better post Duoneb treatment   Reevaluation:  After the interventions noted above, I reevaluated the patient and found  that they have :improved   Social Determinants of Health:  Uninsured    Dispostion:  After consideration of the diagnostic results and the patients response to treatment, I feel that the patent would benefit from 5 day course of prednisone, PCP follow up. Return precautions discussed and provided. Patient discharged in stable condition with no unaddressed concerns.          Final Clinical Impression(s) / ED Diagnoses Final diagnoses:  COVID-19 virus infection    Rx / DC Orders ED Discharge Orders          Ordered    predniSONE (DELTASONE) 20 MG tablet  Daily,   Status:  Discontinued        05/14/22 0517    predniSONE (DELTASONE)  20 MG tablet  Daily        05/14/22 0534              Antony Madura, PA-C 05/14/22 0541    Paula Libra, MD 05/14/22 [phone]

## 2022-05-14 NOTE — Discharge Instructions (Addendum)
Continue using 2 puffs of an albuterol inhaler every 4-6 hours for management of cough, chest tightness, shortness of breath.  Take prednisone as prescribed until finished.  Follow-up with a primary care doctor.

## 2022-05-14 NOTE — ED Notes (Signed)
Pt ambulated to the restroom O2 stayed between 96-100

## 2022-05-21 ENCOUNTER — Emergency Department (HOSPITAL_COMMUNITY): Payer: Self-pay

## 2022-05-21 ENCOUNTER — Emergency Department (HOSPITAL_COMMUNITY)
Admission: EM | Admit: 2022-05-21 | Discharge: 2022-05-21 | Disposition: A | Discharge status: Home / Self Care | Payer: Self-pay | Attending: Emergency Medicine | Admitting: Emergency Medicine

## 2022-05-21 ENCOUNTER — Other Ambulatory Visit: Payer: Self-pay

## 2022-05-21 ENCOUNTER — Encounter (HOSPITAL_COMMUNITY): Payer: Self-pay | Admitting: Emergency Medicine

## 2022-05-21 DIAGNOSIS — R0602 Shortness of breath: Secondary | ICD-10-CM

## 2022-05-21 DIAGNOSIS — J4520 Mild intermittent asthma, uncomplicated: Secondary | ICD-10-CM | POA: Insufficient documentation

## 2022-05-21 DIAGNOSIS — U071 COVID-19: Secondary | ICD-10-CM | POA: Insufficient documentation

## 2022-05-21 DIAGNOSIS — Z7951 Long term (current) use of inhaled steroids: Secondary | ICD-10-CM | POA: Insufficient documentation

## 2022-05-21 MED ORDER — IPRATROPIUM-ALBUTEROL 0.5-2.5 (3) MG/3ML IN SOLN
3.0000 mL | Freq: Once | RESPIRATORY_TRACT | Status: AC
  Administered 2022-05-21: 3 mL via RESPIRATORY_TRACT
  Filled 2022-05-21: qty 3

## 2022-05-21 MED ORDER — ATROVENT HFA 17 MCG/ACT IN AERS: 2.0000 | INHALATION_SPRAY | RESPIRATORY_TRACT | 12 refills | Status: AC | PRN

## 2022-05-21 MED ORDER — ALBUTEROL SULFATE HFA 108 (90 BASE) MCG/ACT IN AERS: 1.0000 | INHALATION_SPRAY | Freq: Four times a day (QID) | RESPIRATORY_TRACT | 6 refills | Status: AC | PRN

## 2022-05-21 MED ORDER — ALBUTEROL SULFATE HFA 108 (90 BASE) MCG/ACT IN AERS: 2.0000 | INHALATION_SPRAY | RESPIRATORY_TRACT | Status: DC | PRN

## 2022-05-21 NOTE — ED Provider Notes (Signed)
[facility]-EMERGENCY DEPT Provider Note   CSN: [id number] Arrival date & time: 05/21/22  0017     History  Chief Complaint  Patient presents with   Cough   Shortness of Breath    Lauren Houston is a 46 y.o. female.  46 year old female with history of asthma presents with complaint of cough and shortness of breath.  Symptoms started 1 week ago, seen here at that time and diagnosed with COVID, treated with prednisone.  Patient has completed the prednisone.  Continues to have cough and shortness of breath, does not improve with inhaler.  Denies fevers, chills.  Has not had COVID previously.  No other complaints or concerns.       Home Medications Prior to Admission medications   Medication Sig Start Date End Date Taking? Authorizing Provider  albuterol (VENTOLIN HFA) 108 (90 Base) MCG/ACT inhaler Inhale 1-2 puffs into the lungs every 6 (six) hours as needed for wheezing or shortness of breath. 05/21/22  Yes Army Melia A, PA-C  ipratropium (ATROVENT HFA) 17 MCG/ACT inhaler Inhale 2 puffs into the lungs every 4 (four) hours as needed for wheezing. 05/21/22  Yes Army Melia A, PA-C  ipratropium (ATROVENT HFA) 17 MCG/ACT inhaler Inhale 2 puffs into the lungs every 4 (four) hours as needed for wheezing. 05/21/22  Yes Jeannie Fend, PA-C  loratadine (CLARITIN) 10 MG tablet Take 1 tablet (10 mg total) by mouth daily. 04/05/15   Hedges, Tinnie Gens, PA-C  predniSONE (DELTASONE) 20 MG tablet Take 2 tablets (40 mg total) by mouth daily. 05/14/22   Antony Madura, PA-C      Allergies    Patient has no known allergies.    Review of Systems   Review of Systems Negative except as per HPI Physical Exam Updated Vital Signs BP 119/66   Pulse 83   Temp 97.8 F (36.6 C) (Oral)   Resp 16   Wt 81.6 kg   LMP 04/29/2022 (Approximate)   SpO2 97%   BMI 30.90 kg/m  Physical Exam Vitals and nursing note reviewed.  Constitutional:      General: She is not in acute  distress.    Appearance: She is well-developed. She is not diaphoretic.  HENT:     Head: Normocephalic and atraumatic.  Cardiovascular:     Rate and Rhythm: Normal rate and regular rhythm.     Pulses: Normal pulses.  Pulmonary:     Effort: Pulmonary effort is normal.     Breath sounds: Normal breath sounds. No decreased breath sounds or wheezing.  Musculoskeletal:     Cervical back: Neck supple.     Right lower leg: No tenderness. No edema.     Left lower leg: No tenderness. No edema.  Skin:    General: Skin is warm and dry.     Findings: No erythema or rash.  Neurological:     Mental Status: She is alert and oriented to person, place, and time.  Psychiatric:        Behavior: Behavior normal.     ED Results / Procedures / Treatments   Labs (all labs ordered are listed, but only abnormal results are displayed) Labs Reviewed - No data to display  EKG EKG Interpretation  Date/Time:  Saturday May 21 2022 03:49:01 EDT Ventricular Rate:  83 PR Interval:  130 QRS Duration: 77 QT Interval:  351 QTC Calculation: 413 R Axis:   75 Text Interpretation: Sinus rhythm Confirmed by Nicanor Alcon, April ([id number]) on 05/21/2022 4:57:37 AM  Radiology DG Chest Port 1 View  Result Date: 05/21/2022 CLINICAL DATA:  Worsening cough and shortness of breath. EXAM: PORTABLE CHEST 1 VIEW COMPARISON:  July 03, 2010 FINDINGS: The heart size and mediastinal contours are within normal limits. Both lungs are clear. The visualized skeletal structures are unremarkable. IMPRESSION: No active disease. Electronically Signed   By: Virgina Norfolk M.D.   On: 05/21/2022 04:35    Procedures Procedures    Medications Ordered in ED Medications  ipratropium-albuterol (DUONEB) 0.5-2.5 (3) MG/3ML nebulizer solution 3 mL (3 mLs Nebulization Given 05/21/22 0516)    ED Course/ Medical Decision Making/ A&P                           Medical Decision Making Amount and/or Complexity of Data  Reviewed Radiology: ordered.  Risk Prescription drug management.   46 year old female with history of asthma, and tested positive for COVID on 05/15/2027, provided with prednisone.  Completed prednisone, continues to have cough and shortness of breath without fevers.  On exam, lungs are clear to auscultation, nontoxic in appearance, no distress.  Chest x-ray is negative for acute disease, agree with radiologist interpretation.  EKG in sinus rhythm rate 83.  Patient is provided with DuoNeb treatment with some improvement.  Discharged with refill of her albuterol as well as prescription for Atrovent inhaler.  Recommend follow-up with primary care provider, provided with referral to [facility], [facility], [facility] wellness with return to ER precautions.        Final Clinical Impression(s) / ED Diagnoses Final diagnoses:  COVID  Shortness of breath  Mild intermittent asthma, unspecified whether complicated    Rx / DC Orders ED Discharge Orders          Ordered    ipratropium (ATROVENT HFA) 17 MCG/ACT inhaler  Every 4 hours PRN        05/21/22 0626    albuterol (VENTOLIN HFA) 108 (90 Base) MCG/ACT inhaler  Every 6 hours PRN        05/21/22 0638    ipratropium (ATROVENT HFA) 17 MCG/ACT inhaler  Every 4 hours PRN        05/21/22 0638              Tacy Learn, PA-C 05/21/22 [place], April, MD 05/21/22 [phone]

## 2022-05-21 NOTE — ED Triage Notes (Addendum)
Pt recently dx with Covid on 10/28, states her cough and sob has worsened in past few days. Hx of asthma, states Albuterol inhaler isn't helping, and neither did recent 5 day course of Prednisone

## 2022-05-21 NOTE — Discharge Instructions (Signed)
Follow-up with [facility] for recheck. Prescription for Atrovent inhaler, can be used in addition to your albuterol inhaler. Return for worsening or concerning symptoms.

## 2022-05-22 ENCOUNTER — Encounter (HOSPITAL_COMMUNITY): Payer: Self-pay | Admitting: Emergency Medicine

## 2022-05-22 ENCOUNTER — Other Ambulatory Visit: Payer: Self-pay

## 2022-05-22 ENCOUNTER — Emergency Department (HOSPITAL_COMMUNITY)
Admission: EM | Admit: 2022-05-22 | Discharge: 2022-05-22 | Disposition: A | Discharge status: Home / Self Care | Payer: Self-pay | Attending: Emergency Medicine | Admitting: Emergency Medicine

## 2022-05-22 DIAGNOSIS — J40 Bronchitis, not specified as acute or chronic: Secondary | ICD-10-CM

## 2022-05-22 DIAGNOSIS — J45909 Unspecified asthma, uncomplicated: Secondary | ICD-10-CM | POA: Insufficient documentation

## 2022-05-22 DIAGNOSIS — U071 COVID-19: Secondary | ICD-10-CM | POA: Insufficient documentation

## 2022-05-22 LAB — CBC WITH DIFFERENTIAL/PLATELET
Abs Immature Granulocytes: 0.04 10*3/uL (ref 0.00–0.07)
Basophils Absolute: 0 10*3/uL (ref 0.0–0.1)
Basophils Relative: 0 %
Eosinophils Absolute: 0.1 10*3/uL (ref 0.0–0.5)
Eosinophils Relative: 1 %
HCT: 41.6 % (ref 36.0–46.0)
Hemoglobin: 13.2 g/dL (ref 12.0–15.0)
Immature Granulocytes: 1 %
Lymphocytes Relative: 30 %
Lymphs Abs: 2 10*3/uL (ref 0.7–4.0)
MCH: 28.1 pg (ref 26.0–34.0)
MCHC: 31.7 g/dL (ref 30.0–36.0)
MCV: 88.7 fL (ref 80.0–100.0)
Monocytes Absolute: 0.6 10*3/uL (ref 0.1–1.0)
Monocytes Relative: 8 %
Neutro Abs: 4 10*3/uL (ref 1.7–7.7)
Neutrophils Relative %: 60 %
Platelets: 323 10*3/uL (ref 150–400)
RBC: 4.69 MIL/uL (ref 3.87–5.11)
RDW: 12.6 % (ref 11.5–15.5)
WBC: 6.7 10*3/uL (ref 4.0–10.5)
nRBC: 0 % (ref 0.0–0.2)

## 2022-05-22 LAB — BASIC METABOLIC PANEL
Anion gap: 8 (ref 5–15)
BUN: 10 mg/dL (ref 6–20)
CO2: 25 mmol/L (ref 22–32)
Calcium: 8.9 mg/dL (ref 8.9–10.3)
Chloride: 105 mmol/L (ref 98–111)
Creatinine, Ser: 0.85 mg/dL (ref 0.44–1.00)
GFR, Estimated: >60 mL/min (ref >60)
Glucose, Bld: 102 mg/dL — ABNORMAL HIGH (ref 70–99)
Potassium: 3.6 mmol/L (ref 3.5–5.1)
Sodium: 138 mmol/L (ref 135–145)

## 2022-05-22 LAB — I-STAT BETA HCG BLOOD, ED (MC, WL, AP ONLY): I-stat hCG, quantitative: <5 m[IU]/mL (ref <5)

## 2022-05-22 LAB — D-DIMER, QUANTITATIVE: D-Dimer, Quant: 0.38 ug/mL-FEU (ref 0.00–0.50)

## 2022-05-22 MED ORDER — IPRATROPIUM-ALBUTEROL 0.5-2.5 (3) MG/3ML IN SOLN
3.0000 mL | Freq: Once | RESPIRATORY_TRACT | Status: AC
  Administered 2022-05-22: 3 mL via RESPIRATORY_TRACT
  Filled 2022-05-22: qty 3

## 2022-05-22 MED ORDER — DOXYCYCLINE HYCLATE 100 MG PO CAPS: 100.0000 mg | ORAL_CAPSULE | Freq: Two times a day (BID) | ORAL | 0 refills | Status: DC

## 2022-05-22 MED ORDER — GUAIFENESIN ER 600 MG PO TB12: 600.0000 mg | ORAL_TABLET | Freq: Two times a day (BID) | ORAL | 0 refills | Status: AC

## 2022-05-22 MED ORDER — DOXYCYCLINE HYCLATE 100 MG PO CAPS: 100.0000 mg | ORAL_CAPSULE | Freq: Two times a day (BID) | ORAL | 0 refills | Status: AC

## 2022-05-22 MED ORDER — ALBUTEROL SULFATE HFA 108 (90 BASE) MCG/ACT IN AERS
2.0000 | INHALATION_SPRAY | Freq: Once | RESPIRATORY_TRACT | Status: DC
  Filled 2022-05-22: qty 6.7

## 2022-05-22 NOTE — Discharge Instructions (Signed)
X-ray shows no evidence of pneumonia.  Use your inhaler every 4 hours for the next 3 days then every 4 hours as needed.  You do not need further steroids.  Keep yourself hydrated.  Use Tylenol Motrin as needed for aches and fever.  Return to the ED with new or worsening symptoms.

## 2022-05-22 NOTE — ED Notes (Signed)
Went to draw labs and administer inhaler, update vitals, patient not in room.

## 2022-05-22 NOTE — ED Provider Notes (Signed)
[facility] DEPT Provider Note   CSN: [id number] Arrival date & time: 05/22/22  0538     History  Chief Complaint  Patient presents with   Mucus   Covid    Lauren Houston is a 46 y.o. female.  Patient diagnosed with COVID on October 28.  States she has been sick since the 19th.  Does have asthma.  Says she feels short of breath and has been coughing and wheezing.  Seen yesterday for the same.  She was given inhalers and nebulizers yesterday and feels slightly better but still feels shortness of breath, chest tightness, coughing up mucus.  Complains of mucus to the back of her throat and sinus drainage.  Feels tight in her chest and coughing with a substantial amount of mucus.  Denies fever.  Completed prednisone 4 days ago.  No history of blood clots.  No exogenous hormone use.  The history is provided by the patient.       Home Medications Prior to Admission medications   Medication Sig Start Date End Date Taking? Authorizing Provider  albuterol (VENTOLIN HFA) 108 (90 Base) MCG/ACT inhaler Inhale 1-2 puffs into the lungs every 6 (six) hours as needed for wheezing or shortness of breath. 05/21/22   Tacy Learn, PA-C  ipratropium (ATROVENT HFA) 17 MCG/ACT inhaler Inhale 2 puffs into the lungs every 4 (four) hours as needed for wheezing. 05/21/22   Tacy Learn, PA-C  ipratropium (ATROVENT HFA) 17 MCG/ACT inhaler Inhale 2 puffs into the lungs every 4 (four) hours as needed for wheezing. 05/21/22   Tacy Learn, PA-C  loratadine (CLARITIN) 10 MG tablet Take 1 tablet (10 mg total) by mouth daily. 04/05/15   Hedges, Dellis Filbert, PA-C  predniSONE (DELTASONE) 20 MG tablet Take 2 tablets (40 mg total) by mouth daily. 05/14/22   Antonietta Breach, PA-C      Allergies    Patient has no known allergies.    Review of Systems   Review of Systems  Constitutional:  Positive for chills and fatigue.  HENT:  Positive for congestion, postnasal drip and rhinorrhea.    Respiratory:  Positive for cough.   Cardiovascular:  Negative for chest pain.  Gastrointestinal:  Negative for abdominal pain, nausea and vomiting.  Genitourinary:  Negative for dysuria and hematuria.  Musculoskeletal:  Negative for arthralgias and myalgias.  Neurological:  Positive for weakness. Negative for dizziness and headaches.    all other systems are negative except as noted in the HPI and PMH.   Physical Exam Updated Vital Signs BP 135/80 (BP Location: Right Arm)   Pulse 94   Temp 98.3 F (36.8 C) (Oral)   Resp 18   Ht 5\' 4"  (1.626 m)   Wt 79.4 kg   LMP 04/29/2022 (Approximate)   SpO2 100%   BMI 30.04 kg/m  Physical Exam Vitals and nursing note reviewed.  Constitutional:      General: She is not in acute distress.    Appearance: She is well-developed.  HENT:     Head: Normocephalic and atraumatic.     Right Ear: Tympanic membrane normal.     Left Ear: Tympanic membrane normal.     Nose: Congestion present.     Mouth/Throat:     Pharynx: No oropharyngeal exudate.  Eyes:     Conjunctiva/sclera: Conjunctivae normal.     Pupils: Pupils are equal, round, and reactive to light.  Neck:     Comments: No meningismus. Cardiovascular:  Rate and Rhythm: Normal rate and regular rhythm.     Heart sounds: Normal heart sounds. No murmur heard. Pulmonary:     Effort: Pulmonary effort is normal. No respiratory distress.     Breath sounds: Normal breath sounds. No wheezing.  Abdominal:     Palpations: Abdomen is soft.     Tenderness: There is no abdominal tenderness. There is no guarding or rebound.  Musculoskeletal:        General: No tenderness. Normal range of motion.     Cervical back: Normal range of motion and neck supple.  Skin:    General: Skin is warm.  Neurological:     Mental Status: She is alert and oriented to person, place, and time.     Cranial Nerves: No cranial nerve deficit.     Motor: No abnormal muscle tone.     Coordination: Coordination  normal.     Comments: No ataxia on finger to nose bilaterally. No pronator drift. 5/5 strength throughout. CN 2-12 intact.Equal grip strength. Sensation intact.   Psychiatric:        Behavior: Behavior normal.     ED Results / Procedures / Treatments   Labs (all labs ordered are listed, but only abnormal results are displayed) Labs Reviewed  BASIC METABOLIC PANEL - Abnormal; Notable for the following components:      Result Value   Glucose, Bld 102 (*)    All other components within normal limits  D-DIMER, QUANTITATIVE  CBC WITH DIFFERENTIAL/PLATELET  I-STAT BETA HCG BLOOD, ED ([facility], [facility], [facility] ONLY)    EKG None  Radiology DG Chest Port 1 View  Result Date: 05/21/2022 CLINICAL DATA:  Worsening cough and shortness of breath. EXAM: PORTABLE CHEST 1 VIEW COMPARISON:  July 03, 2010 FINDINGS: The heart size and mediastinal contours are within normal limits. Both lungs are clear. The visualized skeletal structures are unremarkable. IMPRESSION: No active disease. Electronically Signed   By: Aram Candela M.D.   On: 05/21/2022 04:35    Procedures Procedures    Medications Ordered in ED Medications  albuterol (VENTOLIN HFA) 108 (90 Base) MCG/ACT inhaler 2 puff (has no administration in time range)    ED Course/ Medical Decision Making/ A&P                           Medical Decision Making Amount and/or Complexity of Data Reviewed Labs: ordered. Decision-making details documented in ED Course. Radiology: ordered and independent interpretation performed. Decision-making details documented in ED Course. ECG/medicine tests: ordered and independent interpretation performed. Decision-making details documented in ED Course.  Risk Prescription drug management.  Persistent shortness of breath and coughing who is being diagnosed with COVID on October 28.  No hypoxia or increased work of breathing.  Clear lungs.  Chest x-ray yesterday showed no pneumonia.  Results reviewed and  interpreted by me.  Increasing subjective shortness of breath, chest tightness and difficulty breathing will screen with D-dimer given her recent COVID infection.  D-dimer is negative.  Low suspicion for pulmonary embolism.  Labs are reassuring.  Patient adamant that she needs "Z-Pak" because "this is a respiratory infection which I have all the time and always gets better with a Z-Pak".  Discussed antibiotics not indicated as there is no pneumonia.  She did complete a course of steroids.  She has no hypoxia or increased work of breathing.  Discussed the zithromax is not a benign medication and can cause substantial side effects including prolonged  QT which can be life-threatening.  However as she has been sick for greater than 2 weeks we will give course of doxycycline as well as bronchodilators.  She is satisfied with this and will follow up with her primary doctor.  Return to the ED with new or worsening symptoms.       Final Clinical Impression(s) / ED Diagnoses Final diagnoses:  Bronchitis  COVID    Rx / DC Orders ED Discharge Orders     None         Tamu Golz, Jeannett Senior, MD 05/22/22 1155

## 2022-05-22 NOTE — ED Triage Notes (Signed)
Pt returns to ED, seen yesterday for similar. 10/28 dx with Covid, states she has a lot of mucus and cough. Sats 99% RA

## 2022-06-07 ENCOUNTER — Other Ambulatory Visit: Payer: Self-pay | Admitting: Internal Medicine

## 2022-06-08 LAB — URINE CULTURE
MICRO NUMBER:: 14219043
Result:: NO GROWTH
SPECIMEN QUALITY:: ADEQUATE

## 2022-08-24 ENCOUNTER — Other Ambulatory Visit: Payer: Self-pay | Admitting: Internal Medicine

## 2022-08-25 ENCOUNTER — Other Ambulatory Visit: Payer: Self-pay | Admitting: Internal Medicine

## 2022-08-25 DIAGNOSIS — Z1231 Encounter for screening mammogram for malignant neoplasm of breast: Secondary | ICD-10-CM

## 2022-08-25 LAB — COMPLETE METABOLIC PANEL WITH GFR
AG Ratio: 1.5 (calc) (ref 1.0–2.5)
ALT: 13 U/L (ref 6–29)
AST: 18 U/L (ref 10–35)
Albumin: 4.4 g/dL (ref 3.6–5.1)
Alkaline phosphatase (APISO): 73 U/L (ref 31–125)
BUN/Creatinine Ratio: 7 (calc) (ref 6–22)
BUN: 7 mg/dL (ref 7–25)
CO2: 23 mmol/L (ref 20–32)
Calcium: 9.5 mg/dL (ref 8.6–10.2)
Chloride: 104 mmol/L (ref 98–110)
Creat: 1.03 mg/dL — ABNORMAL HIGH (ref 0.50–0.99)
Globulin: 2.9 g/dL (calc) (ref 1.9–3.7)
Glucose, Bld: 74 mg/dL (ref 65–99)
Potassium: 3.7 mmol/L (ref 3.5–5.3)
Sodium: 138 mmol/L (ref 135–146)
Total Bilirubin: 0.4 mg/dL (ref 0.2–1.2)
Total Protein: 7.3 g/dL (ref 6.1–8.1)
eGFR: 67 mL/min/{1.73_m2} (ref >=60)

## 2022-08-25 LAB — TSH: TSH: 2.85 mIU/L

## 2022-08-25 LAB — LIPID PANEL
Cholesterol: 184 mg/dL (ref <200)
HDL: 61 mg/dL (ref >=50)
LDL Cholesterol (Calc): 105 mg/dL (calc) — ABNORMAL HIGH
Non-HDL Cholesterol (Calc): 123 mg/dL (calc) (ref <130)
Total CHOL/HDL Ratio: 3 (calc) (ref <5.0)
Triglycerides: 87 mg/dL (ref <150)

## 2022-08-25 LAB — CBC
HCT: 40.2 % (ref 35.0–45.0)
Hemoglobin: 13.4 g/dL (ref 11.7–15.5)
MCH: 28.5 pg (ref 27.0–33.0)
MCHC: 33.3 g/dL (ref 32.0–36.0)
MCV: 85.5 fL (ref 80.0–100.0)
MPV: 10.6 fL (ref 7.5–12.5)
Platelets: 281 10*3/uL (ref 140–400)
RBC: 4.7 10*6/uL (ref 3.80–5.10)
RDW: 12.1 % (ref 11.0–15.0)
WBC: 4.7 10*3/uL (ref 3.8–10.8)

## 2022-08-25 LAB — VITAMIN D 25 HYDROXY (VIT D DEFICIENCY, FRACTURES): Vit D, 25-Hydroxy: 20 ng/mL — ABNORMAL LOW (ref 30–100)

## 2022-08-30 ENCOUNTER — Ambulatory Visit
Admission: RE | Admit: 2022-08-30 | Discharge: 2022-08-30 | Disposition: A | Discharge status: Home / Self Care | Payer: Medicaid Other | Source: Ambulatory Visit | Attending: Internal Medicine | Admitting: Internal Medicine

## 2022-08-30 DIAGNOSIS — Z1231 Encounter for screening mammogram for malignant neoplasm of breast: Secondary | ICD-10-CM

## 2022-09-06 ENCOUNTER — Ambulatory Visit: Payer: Medicaid Other | Admitting: Podiatry

## 2022-09-07 ENCOUNTER — Encounter: Payer: Self-pay | Admitting: Podiatry

## 2022-09-07 ENCOUNTER — Ambulatory Visit: Payer: Medicaid Other | Admitting: Podiatry

## 2022-09-07 DIAGNOSIS — M21619 Bunion of unspecified foot: Secondary | ICD-10-CM | POA: Diagnosis not present

## 2022-09-07 DIAGNOSIS — L600 Ingrowing nail: Secondary | ICD-10-CM | POA: Diagnosis not present

## 2022-09-07 NOTE — Progress Notes (Signed)
Subjective:   Patient ID: Lauren Houston, female   DOB: 47 y.o.   MRN: [id number]   HPI Patient states she has had a chronic ingrown toenail of the right big toe that is been sore and also has bunion deformity right over left foot that are painful and states that she has tried wider shoes she has tried other modalities without relief of symptoms.  Patient does not smoke likes to be active   Review of Systems  All other systems reviewed and are negative.       Objective:  Physical Exam Vitals and nursing note reviewed.  Constitutional:      Appearance: She is well-developed.  Pulmonary:     Effort: Pulmonary effort is normal.  Musculoskeletal:        General: Normal range of motion.  Skin:    General: Skin is warm.  Neurological:     Mental Status: She is alert.     Neurovascular status intact muscle strength was found to be adequate range of motion adequate.  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  We educated her on all conditions and treatment options and at this point she will make the decision of which she wants to fix first and then reappoint  X-rays indicate elevation of the intermetatarsal angle right of approximate 15 degrees 13 degrees left with no other pathology

## 2022-09-07 NOTE — Patient Instructions (Signed)

## 2022-09-30 ENCOUNTER — Ambulatory Visit: Payer: Medicaid Other | Admitting: Podiatry

## 2022-09-30 ENCOUNTER — Encounter: Payer: Self-pay | Admitting: Podiatry

## 2022-09-30 DIAGNOSIS — M21619 Bunion of unspecified foot: Secondary | ICD-10-CM

## 2022-09-30 DIAGNOSIS — L600 Ingrowing nail: Secondary | ICD-10-CM

## 2022-09-30 DIAGNOSIS — M216X1 Other acquired deformities of right foot: Secondary | ICD-10-CM | POA: Diagnosis not present

## 2022-09-30 NOTE — Progress Notes (Signed)
Subjective:   Patient ID: Lauren Houston, female   DOB: 47 y.o.   MRN: [id number]   HPI Patient presents with chronic ingrown toenail right big toe and also has bunion deformity that is getting worse trying to figure out when she can get this fixed   ROS      Objective:  Physical Exam  There are status intact incurvated medial border right hallux pressing into the flesh no erythema edema or drainage noted with structural bunion deformity right with enlargement of the bone structure     Assessment:  Ingrown toenail deformity right hallux medial border with pain along with structural bunion deformity right     Plan:  H&P reviewed recommended correction of deformity explained procedure risk she signed consent form after review and I infiltrated the right hallux 60 mg like Marcaine mixture sterile prep done using sterile instrumentation remove medial border exposed matrix applied phenol 3 applications 30 seconds followed by alcohol lavage sterile dressing gave instructions on soaks leave dressing on 24 hours take it off earlier if throbbing were to occur.  Discussed bunion reviewed correction and how long it would take to heal with recovery and patient will continue to look at her schedule and decide when it will be appropriate

## 2022-09-30 NOTE — Patient Instructions (Signed)

## 2023-08-21 ENCOUNTER — Other Ambulatory Visit: Payer: Self-pay

## 2023-08-21 ENCOUNTER — Emergency Department (HOSPITAL_COMMUNITY): Payer: Medicaid Other

## 2023-08-21 ENCOUNTER — Emergency Department (HOSPITAL_COMMUNITY)
Admission: EM | Admit: 2023-08-21 | Discharge: 2023-08-22 | Discharge status: Against Medical Advice | Payer: Medicaid Other | Attending: Student | Admitting: Student

## 2023-08-21 DIAGNOSIS — R059 Cough, unspecified: Secondary | ICD-10-CM | POA: Insufficient documentation

## 2023-08-21 DIAGNOSIS — R0789 Other chest pain: Secondary | ICD-10-CM | POA: Insufficient documentation

## 2023-08-21 DIAGNOSIS — Z5321 Procedure and treatment not carried out due to patient leaving prior to being seen by health care provider: Secondary | ICD-10-CM | POA: Diagnosis not present

## 2023-08-21 DIAGNOSIS — R0602 Shortness of breath: Secondary | ICD-10-CM | POA: Diagnosis not present

## 2023-08-21 DIAGNOSIS — Z1152 Encounter for screening for COVID-19: Secondary | ICD-10-CM | POA: Diagnosis not present

## 2023-08-21 DIAGNOSIS — R079 Chest pain, unspecified: Secondary | ICD-10-CM | POA: Diagnosis present

## 2023-08-21 LAB — CBC WITH DIFFERENTIAL/PLATELET
Abs Immature Granulocytes: 0.02 10*3/uL (ref 0.00–0.07)
Basophils Absolute: 0 10*3/uL (ref 0.0–0.1)
Basophils Relative: 0 %
Eosinophils Absolute: 0 10*3/uL (ref 0.0–0.5)
Eosinophils Relative: 0 %
HCT: 39.1 % (ref 36.0–46.0)
Hemoglobin: 12.4 g/dL (ref 12.0–15.0)
Immature Granulocytes: 0 %
Lymphocytes Relative: 21 %
Lymphs Abs: 1.6 10*3/uL (ref 0.7–4.0)
MCH: 27.4 pg (ref 26.0–34.0)
MCHC: 31.7 g/dL (ref 30.0–36.0)
MCV: 86.5 fL (ref 80.0–100.0)
Monocytes Absolute: 0.4 10*3/uL (ref 0.1–1.0)
Monocytes Relative: 5 %
Neutro Abs: 5.8 10*3/uL (ref 1.7–7.7)
Neutrophils Relative %: 74 %
Platelets: 313 10*3/uL (ref 150–400)
RBC: 4.52 MIL/uL (ref 3.87–5.11)
RDW: 12.1 % (ref 11.5–15.5)
WBC: 7.9 10*3/uL (ref 4.0–10.5)
nRBC: 0 % (ref 0.0–0.2)

## 2023-08-21 LAB — COMPREHENSIVE METABOLIC PANEL
ALT: 21 U/L (ref 0–44)
AST: 19 U/L (ref 15–41)
Albumin: 3.8 g/dL (ref 3.5–5.0)
Alkaline Phosphatase: 56 U/L (ref 38–126)
Anion gap: 13 (ref 5–15)
BUN: 9 mg/dL (ref 6–20)
CO2: 22 mmol/L (ref 22–32)
Calcium: 8.6 mg/dL — ABNORMAL LOW (ref 8.9–10.3)
Chloride: 105 mmol/L (ref 98–111)
Creatinine, Ser: 0.86 mg/dL (ref 0.44–1.00)
GFR, Estimated: >60 mL/min (ref >60)
Glucose, Bld: 105 mg/dL — ABNORMAL HIGH (ref 70–99)
Potassium: 3.3 mmol/L — ABNORMAL LOW (ref 3.5–5.1)
Sodium: 140 mmol/L (ref 135–145)
Total Bilirubin: 0.5 mg/dL (ref 0.0–1.2)
Total Protein: 7.1 g/dL (ref 6.5–8.1)

## 2023-08-21 LAB — RESP PANEL BY RT-PCR (RSV, FLU A&B, COVID)  RVPGX2
Influenza A by PCR: NEGATIVE
Influenza B by PCR: NEGATIVE
Resp Syncytial Virus by PCR: NEGATIVE
SARS Coronavirus 2 by RT PCR: NEGATIVE

## 2023-08-21 LAB — TROPONIN I (HIGH SENSITIVITY): Troponin I (High Sensitivity): <2 ng/L (ref <18)

## 2023-08-21 NOTE — ED Provider Triage Note (Signed)
Emergency Medicine Provider Triage Evaluation Note  Lauren Houston , a 48 y.o. female  was evaluated in triage.  Pt complains of chest pain, shortness of breath, cough.  Patient reports that she has been having the symptoms ongoing for several days without improvement.  States that she was seen at urgent care recently and had a negative viral panel.  States that she however did test positive for hide exposure to influenza from her son back on January 27.  She states that she has had some improvement in symptoms but now has had some worsening shortness of breath.  No prior history of PE or DVT.  No prior cardiac history.  Review of Systems  Positive: As above Negative: As above  Physical Exam  BP 134/76   Pulse (!) 101   Temp 98.3 F (36.8 C) (Oral)   Resp 18   SpO2 100%  Gen:   Awake, no distress   Resp:  Normal effort  MSK:   Moves extremities without difficulty  Other:    Medical Decision Making  Medically screening exam initiated at 10:40 PM.  Appropriate orders placed.  Lauren Houston was informed that the remainder of the evaluation will be completed by another provider, this initial triage assessment does not replace that evaluation, and the importance of remaining in the ED until their evaluation is complete.     Smitty Knudsen, PA-C 08/21/23 2241

## 2023-08-21 NOTE — ED Triage Notes (Signed)
Pt came in for SOB, chest pain, and nonproductive cough. Pt took a breathing treatment with no relief.

## 2023-08-22 LAB — TROPONIN I (HIGH SENSITIVITY): Troponin I (High Sensitivity): <2 ng/L (ref <18)

## 2023-08-22 NOTE — ED Notes (Signed)
Pt wanting to leave, advised against it.

## 2023-08-26 ENCOUNTER — Emergency Department (HOSPITAL_COMMUNITY)
Admission: EM | Admit: 2023-08-26 | Discharge: 2023-08-26 | Disposition: A | Discharge status: Home / Self Care | Payer: Medicaid Other | Attending: Emergency Medicine | Admitting: Emergency Medicine

## 2023-08-26 ENCOUNTER — Other Ambulatory Visit: Payer: Self-pay

## 2023-08-26 ENCOUNTER — Encounter (HOSPITAL_COMMUNITY): Payer: Self-pay | Admitting: Emergency Medicine

## 2023-08-26 ENCOUNTER — Emergency Department (HOSPITAL_COMMUNITY): Payer: Medicaid Other

## 2023-08-26 DIAGNOSIS — Z20822 Contact with and (suspected) exposure to covid-19: Secondary | ICD-10-CM | POA: Diagnosis not present

## 2023-08-26 DIAGNOSIS — R0602 Shortness of breath: Secondary | ICD-10-CM | POA: Diagnosis present

## 2023-08-26 DIAGNOSIS — J4521 Mild intermittent asthma with (acute) exacerbation: Secondary | ICD-10-CM | POA: Diagnosis not present

## 2023-08-26 DIAGNOSIS — J40 Bronchitis, not specified as acute or chronic: Secondary | ICD-10-CM | POA: Diagnosis not present

## 2023-08-26 LAB — CBC
HCT: 37.3 % (ref 36.0–46.0)
Hemoglobin: 11.9 g/dL — ABNORMAL LOW (ref 12.0–15.0)
MCH: 28.1 pg (ref 26.0–34.0)
MCHC: 31.9 g/dL (ref 30.0–36.0)
MCV: 88.2 fL (ref 80.0–100.0)
Platelets: 328 10*3/uL (ref 150–400)
RBC: 4.23 MIL/uL (ref 3.87–5.11)
RDW: 12.9 % (ref 11.5–15.5)
WBC: 11.7 10*3/uL — ABNORMAL HIGH (ref 4.0–10.5)
nRBC: 0 % (ref 0.0–0.2)

## 2023-08-26 LAB — BASIC METABOLIC PANEL
Anion gap: 9 (ref 5–15)
BUN: 8 mg/dL (ref 6–20)
CO2: 24 mmol/L (ref 22–32)
Calcium: 8.8 mg/dL — ABNORMAL LOW (ref 8.9–10.3)
Chloride: 102 mmol/L (ref 98–111)
Creatinine, Ser: 1.01 mg/dL — ABNORMAL HIGH (ref 0.44–1.00)
GFR, Estimated: >60 mL/min (ref >60)
Glucose, Bld: 102 mg/dL — ABNORMAL HIGH (ref 70–99)
Potassium: 3.4 mmol/L — ABNORMAL LOW (ref 3.5–5.1)
Sodium: 135 mmol/L (ref 135–145)

## 2023-08-26 LAB — TROPONIN I (HIGH SENSITIVITY): Troponin I (High Sensitivity): <2 ng/L (ref <18)

## 2023-08-26 LAB — RESP PANEL BY RT-PCR (RSV, FLU A&B, COVID)  RVPGX2
Influenza A by PCR: NEGATIVE
Influenza B by PCR: NEGATIVE
Resp Syncytial Virus by PCR: NEGATIVE
SARS Coronavirus 2 by RT PCR: NEGATIVE

## 2023-08-26 LAB — HCG, SERUM, QUALITATIVE: Preg, Serum: NEGATIVE

## 2023-08-26 MED ORDER — PREDNISONE 50 MG PO TABS: 50.0000 mg | ORAL_TABLET | Freq: Every day | ORAL | 0 refills | Status: AC

## 2023-08-26 MED ORDER — KETOROLAC TROMETHAMINE 30 MG/ML IJ SOLN
30.0000 mg | Freq: Once | INTRAMUSCULAR | Status: AC
  Administered 2023-08-26: 30 mg via INTRAMUSCULAR
  Filled 2023-08-26: qty 1

## 2023-08-26 MED ORDER — DEXAMETHASONE SODIUM PHOSPHATE 10 MG/ML IJ SOLN
10.0000 mg | Freq: Once | INTRAMUSCULAR | Status: AC
  Administered 2023-08-26: 10 mg via INTRAMUSCULAR
  Filled 2023-08-26: qty 1

## 2023-08-26 MED ORDER — IPRATROPIUM-ALBUTEROL 0.5-2.5 (3) MG/3ML IN SOLN
3.0000 mL | Freq: Once | RESPIRATORY_TRACT | Status: AC
  Administered 2023-08-26: 3 mL via RESPIRATORY_TRACT
  Filled 2023-08-26: qty 3

## 2023-08-26 MED ORDER — DOXYCYCLINE HYCLATE 100 MG PO CAPS: 100.0000 mg | ORAL_CAPSULE | Freq: Two times a day (BID) | ORAL | 0 refills | Status: AC

## 2023-08-26 MED ORDER — GUAIFENESIN-CODEINE 100-10 MG/5ML PO SOLN: 5.0000 mL | Freq: Three times a day (TID) | ORAL | 0 refills | Status: AC | PRN

## 2023-08-26 MED ORDER — PREDNISONE 50 MG PO TABS: 50.0000 mg | ORAL_TABLET | Freq: Every day | ORAL | 0 refills | Status: DC

## 2023-08-26 MED ORDER — DOXYCYCLINE HYCLATE 100 MG PO CAPS: 100.0000 mg | ORAL_CAPSULE | Freq: Two times a day (BID) | ORAL | 0 refills | Status: DC

## 2023-08-26 MED ORDER — GUAIFENESIN-CODEINE 100-10 MG/5ML PO SOLN: 5.0000 mL | Freq: Three times a day (TID) | ORAL | 0 refills | Status: DC | PRN

## 2023-08-26 NOTE — ED Provider Notes (Signed)
 [place] EMERGENCY DEPARTMENT AT [facility] Provider Note   CSN: [id number] Arrival date & time: 08/26/23  0021     History  Chief Complaint  Patient presents with   Shortness of Breath   Chest Pain    Lauren Houston is a 48 y.o. female.  Pt is a 48 yo female with pmhx significant for asthma.  Pt said she's had sob, cough, and cp for a week.  She went to UC on 1/30 and on 2/1. She's finished zithromax and prednisone .  She is still not well.  She denies any fevers.         Home Medications Prior to Admission medications   Medication Sig Start Date End Date Taking? Authorizing Provider  albuterol  (VENTOLIN  HFA) 108 (90 Base) MCG/ACT inhaler Inhale 1-2 puffs into the lungs every 6 (six) hours as needed for wheezing or shortness of breath. 05/21/22   Beverley Leita LABOR, PA-C  doxycycline  (VIBRAMYCIN ) 100 MG capsule Take 1 capsule (100 mg total) by mouth 2 (two) times daily. 05/22/22   Rancour, Garnette, MD  doxycycline  (VIBRAMYCIN ) 100 MG capsule Take 1 capsule (100 mg total) by mouth 2 (two) times daily. 08/26/23   Dean Clarity, MD  guaiFENesin  (MUCINEX ) 600 MG 12 hr tablet Take 1 tablet (600 mg total) by mouth 2 (two) times daily. 05/22/22   Rancour, Garnette, MD  guaiFENesin -codeine  100-10 MG/5ML syrup Take 5 mLs by mouth 3 (three) times daily as needed for cough. 08/26/23   Nicholos Aloisi, MD  ipratropium (ATROVENT  HFA) 17 MCG/ACT inhaler Inhale 2 puffs into the lungs every 4 (four) hours as needed for wheezing. 05/21/22   Beverley Leita A, PA-C  ipratropium (ATROVENT  HFA) 17 MCG/ACT inhaler Inhale 2 puffs into the lungs every 4 (four) hours as needed for wheezing. 05/21/22   Beverley Leita LABOR, PA-C  loratadine  (CLARITIN ) 10 MG tablet Take 1 tablet (10 mg total) by mouth daily. 04/05/15   Hedges, Reyes, PA-C  predniSONE  (DELTASONE ) 20 MG tablet Take 2 tablets (40 mg total) by mouth daily. 05/14/22   Keith Sor, PA-C  predniSONE  (DELTASONE ) 50 MG tablet Take 1 tablet (50 mg  total) by mouth daily with breakfast. 08/26/23   Dean Clarity, MD      Allergies    Patient has no known allergies.    Review of Systems   Review of Systems  Respiratory:  Positive for cough and shortness of breath.   Cardiovascular:  Positive for chest pain.  All other systems reviewed and are negative.   Physical Exam Updated Vital Signs BP 108/66 (BP Location: Left Arm)   Pulse 86   Temp 98.2 F (36.8 C) (Oral)   Resp 18   Ht 5' 4 (1.626 m)   Wt 79.4 kg   LMP 08/14/2023   SpO2 99%   BMI 30.05 kg/m  Physical Exam Vitals and nursing note reviewed.  Constitutional:      Appearance: She is well-developed.  HENT:     Head: Normocephalic and atraumatic.     Mouth/Throat:     Mouth: Mucous membranes are moist.     Pharynx: Oropharynx is clear.  Eyes:     Extraocular Movements: Extraocular movements intact.     Pupils: Pupils are equal, round, and reactive to light.  Cardiovascular:     Rate and Rhythm: Normal rate and regular rhythm.  Pulmonary:     Breath sounds: Wheezing present.  Abdominal:     General: Bowel sounds are normal.  Palpations: Abdomen is soft.  Musculoskeletal:        General: Normal range of motion.     Cervical back: Normal range of motion and neck supple.  Skin:    General: Skin is warm.     Capillary Refill: Capillary refill takes less than 2 seconds.  Neurological:     General: No focal deficit present.     Mental Status: She is alert and oriented to person, place, and time.  Psychiatric:        Mood and Affect: Mood normal.        Behavior: Behavior normal.     ED Results / Procedures / Treatments   Labs (all labs ordered are listed, but only abnormal results are displayed) Labs Reviewed  BASIC METABOLIC PANEL - Abnormal; Notable for the following components:      Result Value   Potassium 3.4 (*)    Glucose, Bld 102 (*)    Creatinine, Ser 1.01 (*)    Calcium 8.8 (*)    All other components within normal limits  CBC -  Abnormal; Notable for the following components:   WBC 11.7 (*)    Hemoglobin 11.9 (*)    All other components within normal limits  RESP PANEL BY RT-PCR (RSV, FLU A&B, COVID)  [id number]  HCG, SERUM, QUALITATIVE  TROPONIN I (HIGH SENSITIVITY)  TROPONIN I (HIGH SENSITIVITY)    EKG EKG Interpretation Date/Time:  Saturday August 26 2023 01:25:29 EST Ventricular Rate:  75 PR Interval:  130 QRS Duration:  84 QT Interval:  373 QTC Calculation: 417 R Axis:   57  Text Interpretation: Sinus rhythm Confirmed by Nettie, April ([id number]) on 08/26/2023 1:32:29 AM  Radiology DG Chest 2 View Result Date: 08/26/2023 CLINICAL DATA:  Coughing, shortness of breath and chest pain. No relief with azithromycin and prednisone . EXAM: CHEST - 2 VIEW COMPARISON:  PA and lateral chest 08/21/2023 FINDINGS: The heart size and mediastinal contours are within normal limits. Both lungs are clear. The visualized skeletal structures are intact, with mild lower thoracic levoscoliosis. IMPRESSION: No evidence of acute chest disease or interval change. Stable chest. Electronically Signed   By: Francis Quam M.D.   On: 08/26/2023 01:24    Procedures Procedures    Medications Ordered in ED Medications  dexamethasone  (DECADRON ) injection 10 mg (10 mg Intramuscular Given 08/26/23 0953)  ketorolac  (TORADOL ) 30 MG/ML injection 30 mg (30 mg Intramuscular Given 08/26/23 0953)  ipratropium-albuterol  (DUONEB) 0.5-2.5 (3) MG/3ML nebulizer solution 3 mL (3 mLs Nebulization Given 08/26/23 9046)    ED Course/ Medical Decision Making/ A&P                                 Medical Decision Making Amount and/or Complexity of Data Reviewed Labs: ordered. Radiology: ordered.  Risk OTC drugs. Prescription drug management.   This patient presents to the ED for concern of sob, this involves an extensive number of treatment options, and is a complaint that carries with it a high risk of complications and morbidity.  The differential  diagnosis includes covid/flu/rsv, pna, asthma exac   Co morbidities that complicate the patient evaluation  asthma   Additional history obtained:  Additional history obtained from epic chart review  Lab Tests:  I Ordered, and personally interpreted labs.  The pertinent results include:  cbc nl other than hgb 11.9 (12.4 on 2/3), bmp nl, preg neg, trop nl; covid/flu/rsv neg   Imaging Studies  ordered:  I ordered imaging studies including cxr  I independently visualized and interpreted imaging which showed No evidence of acute chest disease or interval change. Stable chest.  I agree with the radiologist interpretation  Medicines ordered and prescription drug management:  I ordered medication including decadron /toradol /duoneb  for sx  Reevaluation of the patient after these medicines showed that the patient improved I have reviewed the patients home medicines and have made adjustments as needed   Problem List / ED Course:  Asthma exac:  pt is feeling better.  She has been on a low prednisone  dose (20), so she is d/c with 50.  She knows to return if worse.  She is put on doxy as sx have been going on for over 10 days.   Reevaluation:  After the interventions noted above, I reevaluated the patient and found that they have :improved   Social Determinants of Health:  Lives at home   Dispostion:  After consideration of the diagnostic results and the patients response to treatment, I feel that the patent would benefit from discharge with outpatient f/u.          Final Clinical Impression(s) / ED Diagnoses Final diagnoses:  Mild intermittent asthma with exacerbation  Bronchitis    Rx / DC Orders ED Discharge Orders          Ordered    predniSONE  (DELTASONE ) 50 MG tablet  Daily with breakfast,   Status:  Discontinued        08/26/23 1055    doxycycline  (VIBRAMYCIN ) 100 MG capsule  2 times daily,   Status:  Discontinued        08/26/23 1055     guaiFENesin -codeine  100-10 MG/5ML syrup  3 times daily PRN,   Status:  Discontinued        08/26/23 1055    doxycycline  (VIBRAMYCIN ) 100 MG capsule  2 times daily        08/26/23 1056    guaiFENesin -codeine  100-10 MG/5ML syrup  3 times daily PRN        08/26/23 1056    predniSONE  (DELTASONE ) 50 MG tablet  Daily with breakfast        08/26/23 1056              Dean Clarity, MD 08/26/23 1058

## 2023-08-26 NOTE — ED Notes (Signed)
 Pt refused covid swab, Pt reports recently having one done.

## 2023-08-26 NOTE — ED Triage Notes (Signed)
 Pt in with sob, cough and chest pain. Hx of asthma, states recently dx with bronchitis and finished Z-pack, is working on 2nd course of prednisone  and feels no relief of symptoms.

## 2023-10-02 ENCOUNTER — Other Ambulatory Visit: Payer: Self-pay | Admitting: Internal Medicine

## 2023-10-02 DIAGNOSIS — Z0000 Encounter for general adult medical examination without abnormal findings: Secondary | ICD-10-CM

## 2023-10-05 ENCOUNTER — Ambulatory Visit
Admission: RE | Admit: 2023-10-05 | Discharge: 2023-10-05 | Disposition: A | Discharge status: Home / Self Care | Source: Ambulatory Visit | Attending: Internal Medicine | Admitting: Internal Medicine

## 2023-10-05 DIAGNOSIS — Z0000 Encounter for general adult medical examination without abnormal findings: Secondary | ICD-10-CM

## 2024-01-12 ENCOUNTER — Ambulatory Visit: Admitting: Obstetrics and Gynecology

## 2024-01-12 ENCOUNTER — Encounter: Payer: Self-pay | Admitting: Obstetrics and Gynecology

## 2024-01-12 ENCOUNTER — Other Ambulatory Visit (HOSPITAL_COMMUNITY)
Admission: RE | Admit: 2024-01-12 | Discharge: 2024-01-12 | Disposition: A | Discharge status: Home / Self Care | Source: Ambulatory Visit | Attending: Obstetrics and Gynecology | Admitting: Obstetrics and Gynecology

## 2024-01-12 VITALS — BP 119/78 | HR 83 | Ht 64.0 in | Wt 189.4 lb

## 2024-01-12 DIAGNOSIS — Z124 Encounter for screening for malignant neoplasm of cervix: Secondary | ICD-10-CM | POA: Diagnosis not present

## 2024-01-12 DIAGNOSIS — Z0000 Encounter for general adult medical examination without abnormal findings: Secondary | ICD-10-CM | POA: Diagnosis present

## 2024-01-12 NOTE — Progress Notes (Signed)
 New GYN pt.  Mammogram in 09/2023.   Has never had a abnormal PAP. Last PAP 10/05/2021.  Colonoscopy 12/06/2022.  Menses has been abnormal since last May.  Typically 28 days apart.  Was told last June she was premenopausal.

## 2024-01-12 NOTE — Progress Notes (Signed)
 ANNUAL EXAM Patient name: Lauren Houston MRN [id number]  Date of birth: 24-Feb-1976 Chief Complaint:   Gynecologic Exam  History of Present Illness:   Lauren Houston is a 48 y.o. G1P1001 being seen today for a routine annual exam.  Current complaints: irregular menses  Menstrual concerns? Yes  last may they began becoming irregular menses Breast or nipple changes? No  Contraception use? Yes abstincne Sexually active? No   Patient's last menstrual period was 12/24/2023.  Discussed the use of AI scribe software for clinical note transcription with the patient, who gave verbal consent to proceed.  History of Present Illness Lauren Houston is a 48 year old female who presents with irregular menstrual cycles and symptoms suggestive of perimenopause.  She has been experiencing irregular menstrual cycles since May of last year. In May, she did not have a period, but in June, her period lasted the entire month, starting light and becoming heavy in the last week. Since then, her cycles have varied between 24 to 28 days, with some periods lasting only two days instead of her usual five days.  She experiences symptoms associated with perimenopause, including hot flashes and vaginal dryness. She is 30 pounds overweight and has not started any new medications recently. No changes in bowel habits, breast or nipple changes, nipple discharge, or vision changes.  Her family history includes her mother, who experienced menopause quickly at age 4, and her sister, who is still experiencing symptoms at 38-70 years old. She has been tracking her symptoms in relation to her sister's experience.  She inquires about the use of menstrual cups, noting that they often leak despite being properly placed, and wonders if this could be related to her uterus being tilted, as previously mentioned during exams.   Last pap 2023   H/O abnormal pap: no Last mammogram: 09/2023.  Last colonoscopy: 11/2022.       01/12/2024    8:40 AM  Depression screen PHQ 2/9  Decreased Interest 0  Down, Depressed, Hopeless 0  PHQ - 2 Score 0  Altered sleeping 0  Tired, decreased energy 0  Change in appetite 0  Feeling bad or failure about yourself  0  Trouble concentrating 0  Moving slowly or fidgety/restless 0  Suicidal thoughts 0  PHQ-9 Score 0        01/12/2024    8:41 AM  GAD 7 : Generalized Anxiety Score  Nervous, Anxious, on Edge 1  Control/stop worrying 0  Worry too much - different things 1  Trouble relaxing 0  Restless 0  Easily annoyed or irritable 0  Afraid - awful might happen 0  Total GAD 7 Score 2     Review of Systems:   Pertinent items are noted in HPI Denies any headaches, blurred vision, fatigue, shortness of breath, chest pain, abdominal pain, abnormal vaginal discharge/itching/odor/irritation, problems with periods, bowel movements, urination, or intercourse unless otherwise stated above. Pertinent History Reviewed:  Reviewed past medical,surgical, social and family history.  Reviewed problem list, medications and allergies. Physical Assessment:   Vitals:   01/12/24 0817  BP: 119/78  Pulse: 83  Weight: 189 lb 6.4 oz (85.9 kg)  Height: 5' 4 (1.626 m)  Body mass index is 32.51 kg/m.        Physical Examination:   General appearance - well appearing, and in no distress  Mental status - alert, oriented to person, place, and time  Psych:  She has a normal mood and affect  Skin -  warm and dry, normal color, no suspicious lesions noted  Chest - effort normal, all lung fields clear to auscultation bilaterally  Heart - normal rate and regular rhythm  Breasts - breasts appear normal, no suspicious masses, no skin or nipple changes or  axillary nodes  Abdomen - soft, nontender, nondistended, no masses or organomegaly  Pelvic -  VULVA: normal appearing vulva with no masses, tenderness or lesions   VAGINA: normal appearing vagina with normal color and discharge, no lesions    CERVIX: normal appearing cervix without discharge or lesions, no CMT  Thin prep pap is done with HR HPV cotesting  UTERUS: uterus is felt to be normal size, shape, consistency and nontender   ADNEXA: No adnexal masses or tenderness noted.  Extremities:  No swelling or varicosities noted  Chaperone present for exam  No results found for this or any previous visit (from the past 24 hours).    Assessment & Plan:   Assessment & Plan Perimenopause Experiencing irregular cycles, hot flashes, and vaginal dryness due to declining estrogen. Discussed HRT for symptom management, particularly hot flashes. Explained pregnancy is possible until periods cease for a year. - Consider HRT if symptoms significantly impact quality of life. - Implement lifestyle modifications to manage symptoms.  Menstrual Cup Leakage Leakage likely due to cup shape rather than anatomical issues. Cervix in normal position. - Try different brands or shapes of menstrual cups or discs.  Well Woman Examination Last mammogram normal. Recent cholesterol, thyroid, and A1c checks. Working on [employer] management and muscle recovery. - Consider magnesium supplementation for muscle recovery. - Cervical cancer screening: Discussed guidelines. Pap with HPV collected - STD Testing: not indicated - Birth Control: Discussed options and their risks, benefits and common side effects; discussed VTE with estrogen containing options. Desires: abstinence - Breast Health: Encouraged self breast awareness/SBE. Teaching provided. Discussed limits of clinical breast exam for detecting breast cancer. MXR is up to date: 2025 - F/U 12 months and prn     No orders of the defined types were placed in this encounter.   Meds: No orders of the defined types were placed in this encounter.   Follow-up: No follow-ups on file.  Carter Quarry, MD 01/12/2024 8:33 AM

## 2024-01-16 LAB — CYTOLOGY - PAP
Comment: NEGATIVE
Diagnosis: NEGATIVE
High risk HPV: NEGATIVE

## 2024-01-17 ENCOUNTER — Ambulatory Visit: Payer: Self-pay | Admitting: Obstetrics and Gynecology
